# Patient Record
Sex: Female | Born: 1950 | Race: Black or African American | Hispanic: No | Smoking: Never smoker
Health system: Southern US, Community
[De-identification: ages and names within clinical notes are randomized; demographics above are authoritative.]

## PROBLEM LIST (undated history)

## (undated) DIAGNOSIS — E039 Hypothyroidism, unspecified: Secondary | ICD-10-CM

## (undated) DIAGNOSIS — E119 Type 2 diabetes mellitus without complications: Secondary | ICD-10-CM

## (undated) DIAGNOSIS — E079 Disorder of thyroid, unspecified: Secondary | ICD-10-CM

## (undated) DIAGNOSIS — I10 Essential (primary) hypertension: Secondary | ICD-10-CM

## (undated) DIAGNOSIS — J4520 Mild intermittent asthma, uncomplicated: Secondary | ICD-10-CM

## (undated) HISTORY — PX: ABDOMINAL HYSTERECTOMY: SHX81

---

## 2018-10-12 ENCOUNTER — Encounter: Payer: Self-pay | Admitting: Emergency Medicine

## 2018-10-12 ENCOUNTER — Other Ambulatory Visit: Payer: Self-pay

## 2018-10-12 ENCOUNTER — Emergency Department: Payer: BLUE CROSS/BLUE SHIELD

## 2018-10-12 ENCOUNTER — Emergency Department
Admission: EM | Admit: 2018-10-12 | Discharge: 2018-10-12 | Disposition: A | Discharge status: Home / Self Care | Payer: BLUE CROSS/BLUE SHIELD | Attending: Emergency Medicine | Admitting: Emergency Medicine

## 2018-10-12 DIAGNOSIS — Z880 Allergy status to penicillin: Secondary | ICD-10-CM | POA: Diagnosis not present

## 2018-10-12 DIAGNOSIS — R509 Fever, unspecified: Secondary | ICD-10-CM | POA: Diagnosis present

## 2018-10-12 DIAGNOSIS — R05 Cough: Secondary | ICD-10-CM | POA: Diagnosis not present

## 2018-10-12 DIAGNOSIS — Z7984 Long term (current) use of oral hypoglycemic drugs: Secondary | ICD-10-CM | POA: Insufficient documentation

## 2018-10-12 DIAGNOSIS — J988 Other specified respiratory disorders: Secondary | ICD-10-CM | POA: Insufficient documentation

## 2018-10-12 DIAGNOSIS — Z79899 Other long term (current) drug therapy: Secondary | ICD-10-CM | POA: Diagnosis not present

## 2018-10-12 DIAGNOSIS — E119 Type 2 diabetes mellitus without complications: Secondary | ICD-10-CM | POA: Insufficient documentation

## 2018-10-12 DIAGNOSIS — B9789 Other viral agents as the cause of diseases classified elsewhere: Secondary | ICD-10-CM

## 2018-10-12 DIAGNOSIS — I10 Essential (primary) hypertension: Secondary | ICD-10-CM | POA: Diagnosis not present

## 2018-10-12 DIAGNOSIS — Z20828 Contact with and (suspected) exposure to other viral communicable diseases: Secondary | ICD-10-CM | POA: Insufficient documentation

## 2018-10-12 HISTORY — DX: Essential (primary) hypertension: I10

## 2018-10-12 HISTORY — DX: Type 2 diabetes mellitus without complications: E11.9

## 2018-10-12 LAB — COMPREHENSIVE METABOLIC PANEL
ALBUMIN: 2.9 g/dL — AB (ref 3.5–5.0)
ALT: 12 U/L (ref 0–44)
ANION GAP: 10 (ref 5–15)
AST: 20 U/L (ref 15–41)
Alkaline Phosphatase: 45 U/L (ref 38–126)
BILIRUBIN TOTAL: 0.5 mg/dL (ref 0.3–1.2)
BUN: 15 mg/dL (ref 8–23)
CO2: 22 mmol/L (ref 22–32)
Calcium: 8.1 mg/dL — ABNORMAL LOW (ref 8.9–10.3)
Chloride: 102 mmol/L (ref 98–111)
Creatinine, Ser: 0.9 mg/dL (ref 0.44–1.00)
GFR calc Af Amer: >60 mL/min (ref >60)
GFR calc non Af Amer: >60 mL/min (ref >60)
Glucose, Bld: 199 mg/dL — ABNORMAL HIGH (ref 70–99)
Potassium: 3.2 mmol/L — ABNORMAL LOW (ref 3.5–5.1)
Sodium: 134 mmol/L — ABNORMAL LOW (ref 135–145)
Total Protein: 7.2 g/dL (ref 6.5–8.1)

## 2018-10-12 LAB — CBC WITH DIFFERENTIAL/PLATELET
Abs Immature Granulocytes: 0.05 10*3/uL (ref 0.00–0.07)
Basophils Absolute: 0 10*3/uL (ref 0.0–0.1)
Basophils Relative: 0 %
Eosinophils Absolute: 0 10*3/uL (ref 0.0–0.5)
Eosinophils Relative: 0 %
HCT: 34.4 % — ABNORMAL LOW (ref 36.0–46.0)
HEMOGLOBIN: 10.9 g/dL — AB (ref 12.0–15.0)
Immature Granulocytes: 1 %
Lymphocytes Relative: 23 %
Lymphs Abs: 1.9 10*3/uL (ref 0.7–4.0)
MCH: 26.7 pg (ref 26.0–34.0)
MCHC: 31.7 g/dL (ref 30.0–36.0)
MCV: 84.3 fL (ref 80.0–100.0)
MONO ABS: 0.4 10*3/uL (ref 0.1–1.0)
Monocytes Relative: 5 %
Neutro Abs: 5.7 10*3/uL (ref 1.7–7.7)
Neutrophils Relative %: 71 %
Platelets: 413 10*3/uL — ABNORMAL HIGH (ref 150–400)
RBC: 4.08 MIL/uL (ref 3.87–5.11)
RDW: 14.6 % (ref 11.5–15.5)
Smear Review: ADEQUATE
WBC: 8 10*3/uL (ref 4.0–10.5)
nRBC: 0 % (ref 0.0–0.2)

## 2018-10-12 LAB — URINALYSIS, COMPLETE (UACMP) WITH MICROSCOPIC
Bilirubin Urine: NEGATIVE
Glucose, UA: NEGATIVE mg/dL
Hgb urine dipstick: NEGATIVE
Ketones, ur: NEGATIVE mg/dL
Nitrite: NEGATIVE
Protein, ur: 30 mg/dL — AB
Specific Gravity, Urine: 1.019 (ref 1.005–1.030)
pH: 5 (ref 5.0–8.0)

## 2018-10-12 LAB — INFLUENZA PANEL BY PCR (TYPE A & B)
Influenza A By PCR: NEGATIVE
Influenza B By PCR: NEGATIVE

## 2018-10-12 MED ORDER — IOHEXOL 300 MG/ML  SOLN
75.0000 mL | Freq: Once | INTRAMUSCULAR | Status: AC | PRN
  Administered 2018-10-12: 75 mL via INTRAVENOUS

## 2018-10-12 MED ORDER — SODIUM CHLORIDE 0.9 % IV BOLUS
1000.0000 mL | Freq: Once | INTRAVENOUS | Status: AC
  Administered 2018-10-12: 1000 mL via INTRAVENOUS

## 2018-10-12 MED ORDER — AZITHROMYCIN 250 MG PO TABS: ORAL_TABLET | ORAL | 0 refills | Status: AC

## 2018-10-12 NOTE — ED Triage Notes (Signed)
Cough and fever x 5 days. Visiting from [place]. Has been tested negative for flu and strep. No cough.

## 2018-10-12 NOTE — Discharge Instructions (Addendum)
Your CAT scan looks very suspicious for the coronavirus.  Please read over the coronavirus instructions that we have provided to you separately.  Please be sure to quarantine yourself . The test that we have done will take 4 days to get back.  Public health should contact you and let you know that you are clear if it is negative.  Please return here if you get a higher fever or get more short of breath.  Even if you get worse at 3:00 in the morning you need to come back when you get worse.  If need be we will put you in the hospital and give you oxygen.  I have given you prescription for Zithromax in case there is any other kind of pneumonia going on but it does not look like that.

## 2018-10-12 NOTE — ED Notes (Signed)
Went to pt room and she is resting comfortable in NAD - vital signs obtained  - advised that Dr Darnelle Catalan would be to room shortly to discuss test results

## 2018-10-12 NOTE — ED Notes (Signed)
Patient transported to CT 

## 2018-10-12 NOTE — ED Provider Notes (Signed)
[facility] Emergency Department Provider Note   ____________________________________________   First MD Initiated Contact with Patient 10/12/18 [phone]     (approximate)  I have reviewed the triage vital signs and the nursing notes.   HISTORY  Chief Complaint Fever   HPI Jackie French is a 68 y.o. female who is visiting from [place].  She is been running a fever for 5 days.  Was 102 at home today.  She had a negative flu test recently.  Patient reports some mild headache a mild sore throat no nausea vomiting or diarrhea no dysuria no abdominal pain no cough  Past medical history also includes asthma.  Patient is not eating much and only drinking 1 bottle of water a day.      Past Medical History:  Diagnosis Date   Diabetes mellitus without complication (HCC)    Hypertension     There are no active problems to display for this patient.   Past Surgical History:  Procedure Laterality Date   ABDOMINAL HYSTERECTOMY      Prior to Admission medications   Medication Sig Start Date End Date Taking? Authorizing Provider  amLODipine (NORVASC) 5 MG tablet Take 5 mg by mouth daily. 10/04/18  Yes [provider]  atorvastatin (LIPITOR) 20 MG tablet Take 20 mg by mouth Nightly. 09/24/18  Yes [provider]  cetirizine (ZYRTEC) 10 MG tablet Take 10 mg by mouth daily.   Yes [provider]  fluticasone (FLONASE) 50 MCG/ACT nasal spray Place 1 spray into both nostrils daily.   Yes [provider]  metFORMIN (GLUCOPHAGE-XR) 500 MG 24 hr tablet Take 1,000 mg by mouth 2 (two) times daily. 07/16/18 07/16/19 Yes [provider]  montelukast (SINGULAIR) 10 MG tablet Take 10 mg by mouth Nightly.   Yes [provider]  azithromycin (ZITHROMAX Z-PAK) 250 MG tablet Take 2 tablets (500 mg) on  Day 1,  followed by 1 tablet (250 mg) once daily on Days 2 through 5. 10/12/18 10/17/18  Arnaldo Natal, MD     Allergies Penicillins and Shellfish allergy  History reviewed. No pertinent family history.  Social History Social History   Tobacco Use   Smoking status: Not on file  Substance Use Topics   Alcohol use: Not on file   Drug use: Not on file    Review of Systems  Constitutional:  fever/chills Eyes: No visual changes. ENT: Mild sore throat. Cardiovascular: Denies chest pain. Respiratory: Denies shortness of breath. Gastrointestinal: No abdominal pain.  No nausea, no vomiting.  No diarrhea.  No constipation. Genitourinary: Negative for dysuria. Musculoskeletal: Negative for back pain. Skin: Negative for rash. Neurological: Negative for focal weakness or numbness.   ____________________________________________   PHYSICAL EXAM:  VITAL SIGNS: ED Triage Vitals  Enc Vitals Group     BP 10/12/18 0929 137/81     Pulse Rate 10/12/18 0929 89     Resp 10/12/18 0929 20     Temp 10/12/18 0929 98.1 F (36.7 C)     Temp Source 10/12/18 0929 Oral     SpO2 10/12/18 0929 97 %     Weight 10/12/18 0931 210 lb (95.3 kg)     Height 10/12/18 0931 5\' 7"  (1.702 m)     Head Circumference --      Peak Flow --      Pain Score 10/12/18 0931 0     Pain Loc --      Pain Edu? --  Excl. in GC? --     Constitutional: Alert and oriented. Well appearing and in no acute distress. Eyes: Conjunctivae are normal. PERRL. EOMI. Head: Atraumatic. Nose: No congestion/rhinnorhea. Mouth/Throat: Mucous membranes are moist.  Oropharynx non-erythematous. Neck: No stridor.  No cervical spine tenderness to palpation.  Supple Hematological/Lymphatic/Immunilogical: No cervical lymphadenopathy. Cardiovascular: Normal rate, regular rhythm. Grossly normal heart sounds.  Good peripheral circulation. Respiratory: Normal respiratory effort.  No retractions. Lungs CTAB. Gastrointestinal: Soft and nontender. No distention. No abdominal bruits. No CVA tenderness. Musculoskeletal: No lower extremity  tenderness nor edema.  Neurologic:  Normal speech and language. No gross focal neurologic deficits are appreciated.  Patient is slightly unsteady on her feet Skin:  Skin is warm, dry and intact. No rash noted. Psychiatric: Mood and affect are normal. Speech and behavior are normal.  ____________________________________________   LABS (all labs ordered are listed, but only abnormal results are displayed)  Labs Reviewed  URINALYSIS, COMPLETE (UACMP) WITH MICROSCOPIC - Abnormal; Notable for the following components:      Result Value   Color, Urine YELLOW (*)    APPearance HAZY (*)    Protein, ur 30 (*)    Leukocytes,Ua MODERATE (*)    Bacteria, UA RARE (*)    All other components within normal limits  CBC WITH DIFFERENTIAL/PLATELET - Abnormal; Notable for the following components:   Hemoglobin 10.9 (*)    HCT 34.4 (*)    Platelets 413 (*)    All other components within normal limits  COMPREHENSIVE METABOLIC PANEL - Abnormal; Notable for the following components:   Sodium 134 (*)    Potassium 3.2 (*)    Glucose, Bld 199 (*)    Calcium 8.1 (*)    Albumin 2.9 (*)    All other components within normal limits  NOVEL CORONAVIRUS, NAA (HOSPITAL ORDER, SEND-OUT TO REF LAB)  RESPIRATORY PANEL BY PCR  INFLUENZA PANEL BY PCR (TYPE A & B)  CBC WITH DIFFERENTIAL/PLATELET   ____________________________________________  EKG   ____________________________________________  RADIOLOGY  ED MD interpretation: Chest x-ray read by radiology reviewed by me shows some haziness in both bases we will go ahead and CT the patient  Official radiology report(s): Ct Chest W Contrast  Result Date: 10/12/2018 CLINICAL DATA:  Fever and cough 5 days. Visiting from [place]. Tested negative for flu and strep. Currently being tested for Covid19. EXAM: CT CHEST WITH CONTRAST TECHNIQUE: Multidetector CT imaging of the chest was performed during intravenous contrast administration. CONTRAST:  75mL OMNIPAQUE  IOHEXOL 300 MG/ML  SOLN COMPARISON:  Chest x-ray today. FINDINGS: Cardiovascular: Heart is normal size. Mild calcified plaque over the left anterior descending and right coronary arteries. Minimal calcified plaque over the thoracic aorta. Pulmonary arterial system and remaining vascular structures are unremarkable. Mediastinum/Nodes: 1.2 cm AP window lymph node and 1 cm right paratracheal lymph node likely reactive in nature. No significant hilar adenopathy. Substernal goiter. Remaining mediastinal structures are unremarkable. Lungs/Pleura: Lungs are adequately inflated and demonstrate patchy bilateral ground-glass opacification most prominent over the mid to lower lungs and most prominent in a peripheral distribution highly suggestive of COVID19 infection. No evidence of effusion. Airways are normal. Upper Abdomen: No acute findings. Minimal calcified plaque over the abdominal aorta. Musculoskeletal: Unremarkable. IMPRESSION: 1. Pattern of patchy bilateral ground-glass airspace disease highly suggestive of COVID19 infection. Minimal mediastinal adenopathy likely reactive. Bacterial pneumonia versus atypical infectious or inflammatory processes are less likely. 2. Aortic Atherosclerosis (ICD10-I70.0). Mild atherosclerotic coronary artery disease. Findings communicated to Dr. Juliette Alcide in the  ER by Dr. Llana Aliment at 1:05 p.m. Electronically Signed   By: Elberta Fortis M.D.   On: 10/12/2018 13:44   Dg Chest Portable 1 View  Result Date: 10/12/2018 CLINICAL DATA:  Cough and fever 5 days. Visiting from [place]. Negative test for flu and strep. EXAM: PORTABLE CHEST 1 VIEW COMPARISON:  None. FINDINGS: Patient slightly rotated to the left. Lungs are adequately inflated demonstrate subtle patchy density in the lung bases right worse than left which may be due to mild vascular congestion versus atelectasis or infection. No effusion. Cardiomediastinal silhouette and remainder of the exam is unremarkable. IMPRESSION: Subtle  patchy density in the lung bases right worse than left which could be seen with mild vascular congestion, atelectasis or early infection. Electronically Signed   By: Elberta Fortis M.D.   On: 10/12/2018 10:45    ____________________________________________   PROCEDURES  Procedure(s) performed (including Critical Care):  Procedures   ____________________________________________   INITIAL IMPRESSION / ASSESSMENT AND PLAN / ED COURSE  Patient's daughter tells me that the patient herself has been staying in a separate part of the house with his own air circulation system.  Patient has not left that part of the house.  Patient's daughter is been in and out repeatedly.  Patient's children and husband have gone in twice briefly to say hi.  I have not gone in otherwise.  Patient's children are under the care of a nanny.  I have discussed the situation with infection prevention here at [facility].  Infection prevention does not think that is necessary to quarantine the nanny.  I will however have the daughter of the patient inform the nanny of the situation so that if the nanny becomes sick she gets checked quickly.              ____________________________________________   FINAL CLINICAL IMPRESSION(S) / ED DIAGNOSES  Final diagnoses:  Viral respiratory illness     ED Discharge Orders         Ordered    azithromycin (ZITHROMAX Z-PAK) 250 MG tablet     10/12/18 1312           Note:  This document was prepared using Dragon voice recognition software and may include unintentional dictation errors.    Arnaldo Natal, MD 10/12/18 1455

## 2018-10-12 NOTE — ED Notes (Signed)
Due to specimen leakage of fluid from flu swab all labs and swabs need to be recollected - recollected at this time

## 2018-10-13 LAB — RESPIRATORY PANEL BY PCR
Adenovirus: NOT DETECTED
Bordetella pertussis: NOT DETECTED
Chlamydophila pneumoniae: NOT DETECTED
Coronavirus 229E: NOT DETECTED
Coronavirus HKU1: NOT DETECTED
Coronavirus NL63: NOT DETECTED
Coronavirus OC43: NOT DETECTED
INFLUENZA A-RVPPCR: NOT DETECTED
Influenza B: NOT DETECTED
Metapneumovirus: NOT DETECTED
Mycoplasma pneumoniae: NOT DETECTED
Parainfluenza Virus 1: NOT DETECTED
Parainfluenza Virus 2: NOT DETECTED
Parainfluenza Virus 3: NOT DETECTED
Parainfluenza Virus 4: NOT DETECTED
Respiratory Syncytial Virus: NOT DETECTED
Rhinovirus / Enterovirus: NOT DETECTED

## 2018-10-19 LAB — NOVEL CORONAVIRUS, NAA (HOSP ORDER, SEND-OUT TO REF LAB; TAT 18-24 HRS): SARS-CoV-2, NAA: NOT DETECTED

## 2018-10-21 ENCOUNTER — Telehealth: Payer: Self-pay | Admitting: Emergency Medicine

## 2018-10-21 NOTE — Telephone Encounter (Signed)
Called patient to inform of negative covid 19 result.  Spoke to daughter as patient has been confined to room.

## 2019-07-25 HISTORY — PX: THYROIDECTOMY: SHX17

## 2019-09-01 ENCOUNTER — Ambulatory Visit: Payer: BLUE CROSS/BLUE SHIELD

## 2019-09-20 ENCOUNTER — Ambulatory Visit: Payer: BLUE CROSS/BLUE SHIELD

## 2020-03-08 IMAGING — DX PORTABLE CHEST - 1 VIEW
1 series · 1 of 1 positions shown · non-contrast
Comparison: None.

CLINICAL DATA: Cough and fever 5 days. Visiting from [place].
Negative test for flu and strep.

EXAM:
PORTABLE CHEST 1 VIEW

[chest ap]
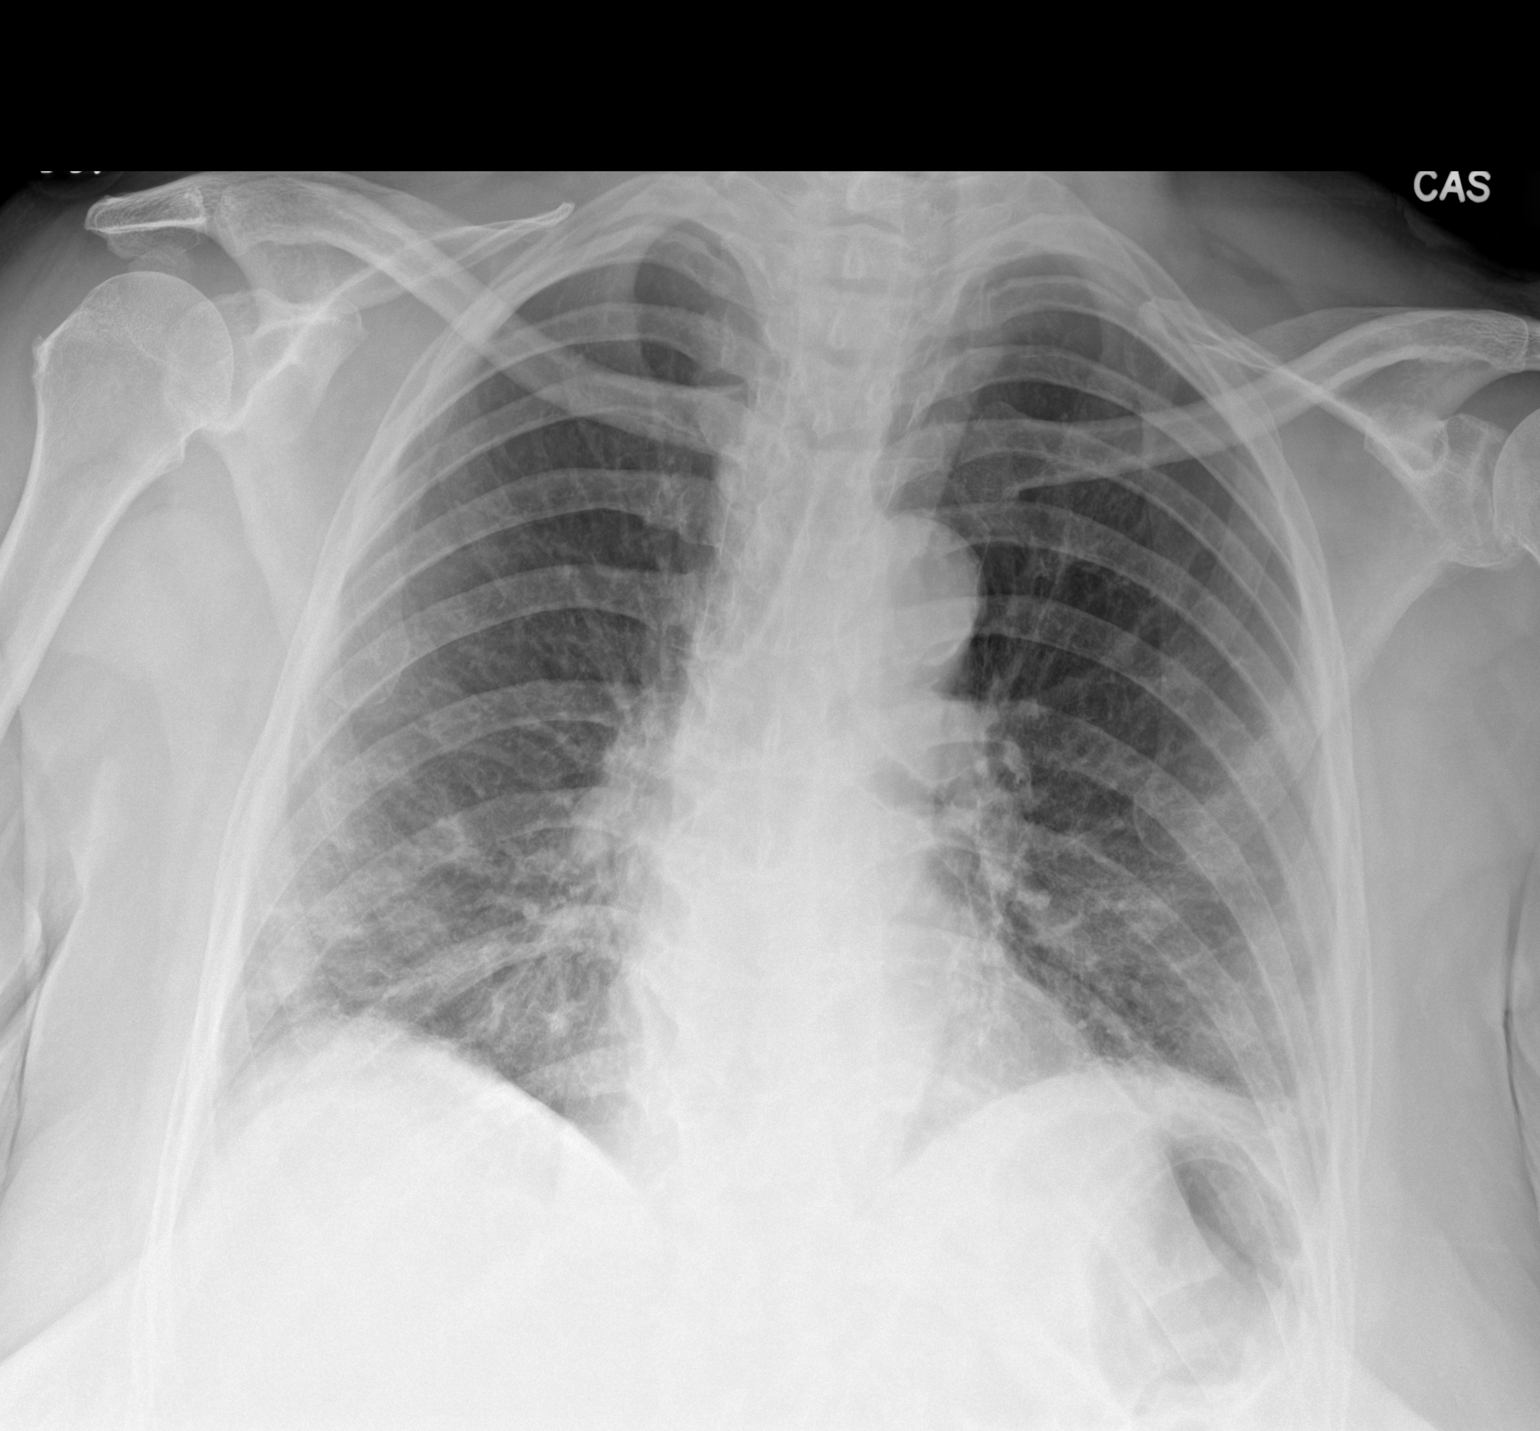

[1 of 1 positions shown; findings below may reference images not displayed]

FINDINGS: Patient slightly rotated to the left. Lungs are adequately inflated
demonstrate subtle patchy density in the lung bases right worse than
left which may be due to mild vascular congestion versus atelectasis
or infection. No effusion. Cardiomediastinal silhouette and
remainder of the exam is unremarkable.
IMPRESSION: Subtle patchy density in the lung bases right worse than left which
could be seen with mild vascular congestion, atelectasis or early
infection.

## 2020-03-08 IMAGING — CT CT CHEST WITH CONTRAST
2 of 3 series · 15 of 36 positions shown, 18 images · IV contrast (omnipaque)
Comparison: Chest x-ray today.

CLINICAL DATA: Fever and cough 5 days. Visiting from [place].
Tested negative for flu and strep. Currently being tested for
PovidY8.

EXAM:
CT CHEST WITH CONTRAST
TECHNIQUE: Multidetector CT imaging of the chest was performed during
intravenous contrast administration.
CONTRAST:  75mL OMNIPAQUE IOHEXOL 300 MG/ML  SOLN

[Series 2: axial st · axial · 0.70mm/px · z∈[-450,-202]mm · 12 of 146 slices shown, 15 images]
[im 11/146  mediastinal]
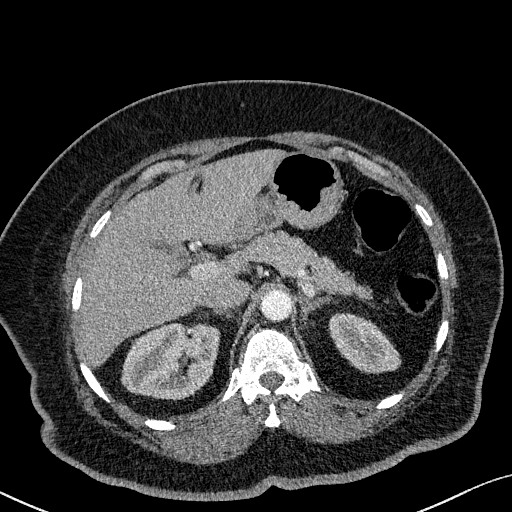
[im 11/146  lung]
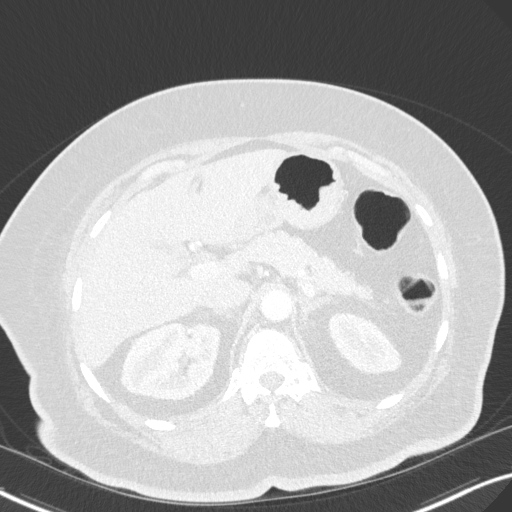
[im 22/146  lung]
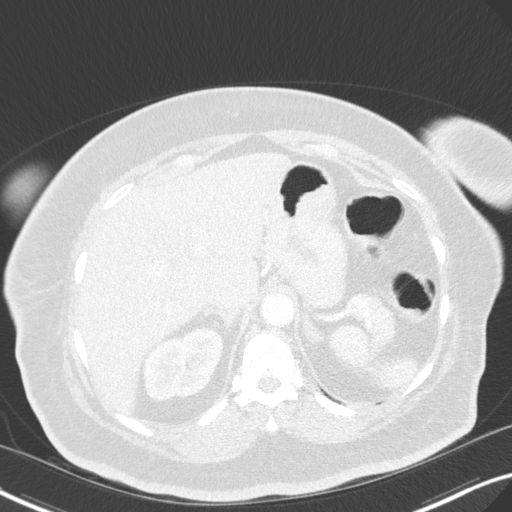
[im 33/146  lung]
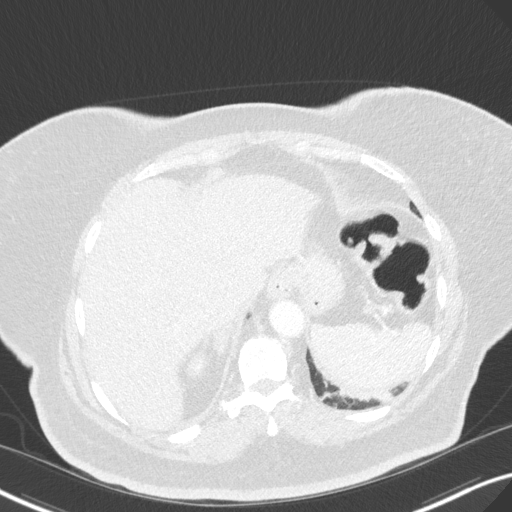
[im 43/146  lung]
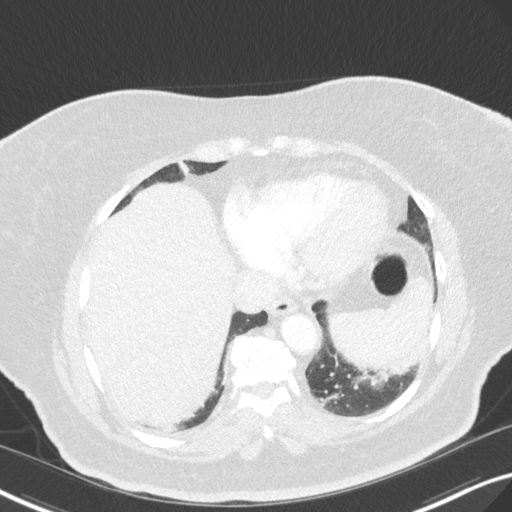
[im 54/146  mediastinal]
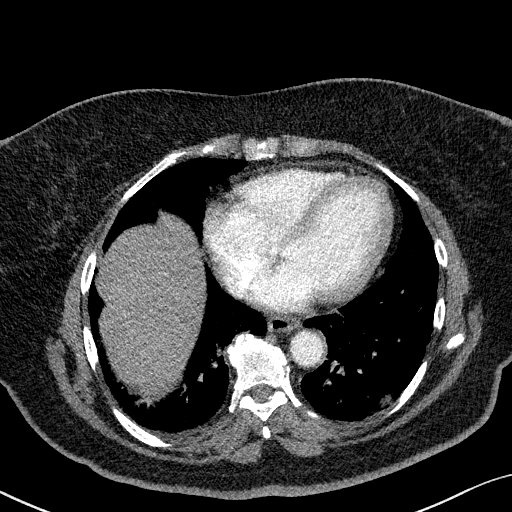
[im 54/146  lung]
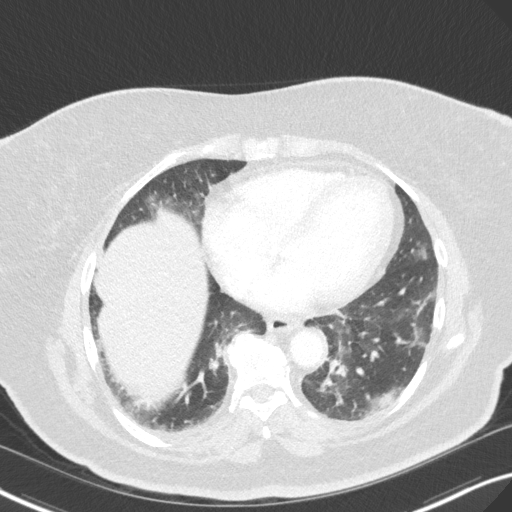
[im 65/146  lung]
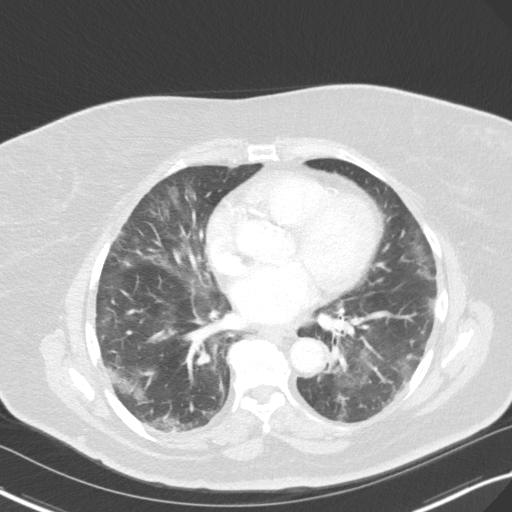
[im 81/146  lung]
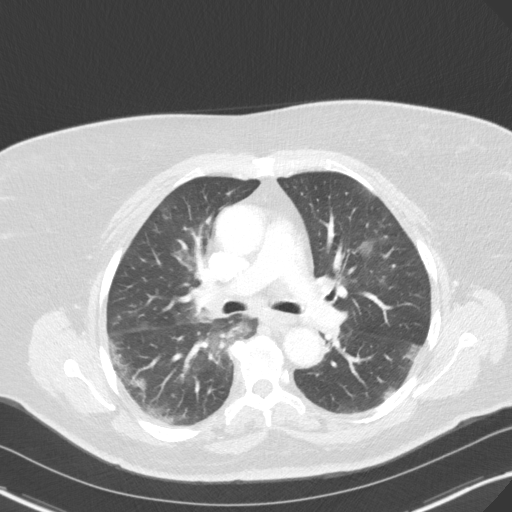
[im 92/146  lung]
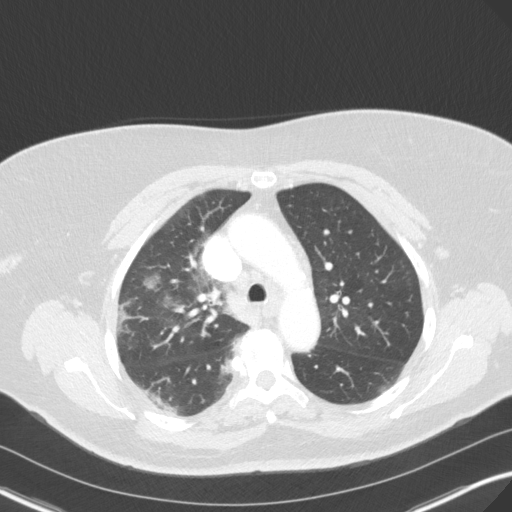
[im 103/146  mediastinal]
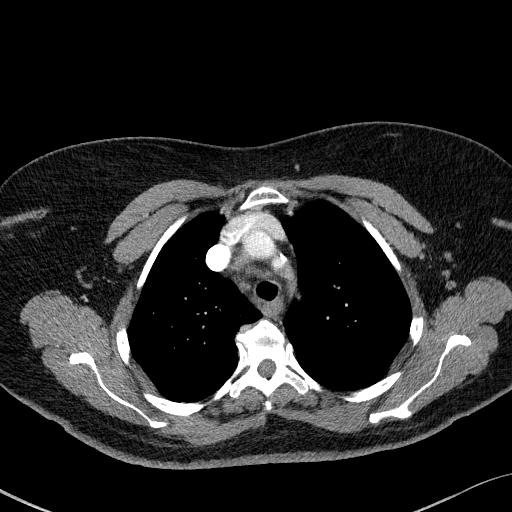
[im 103/146  lung]
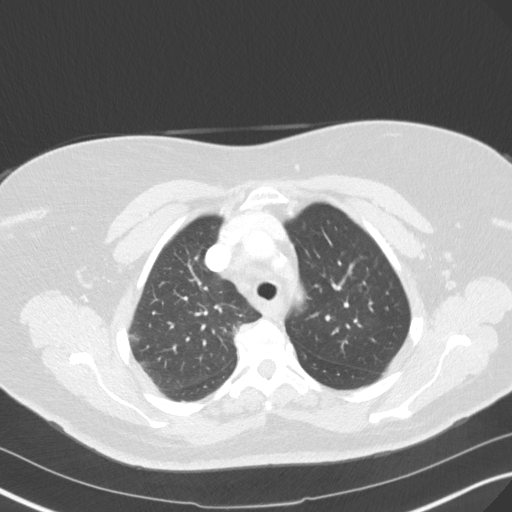
[im 113/146  lung]
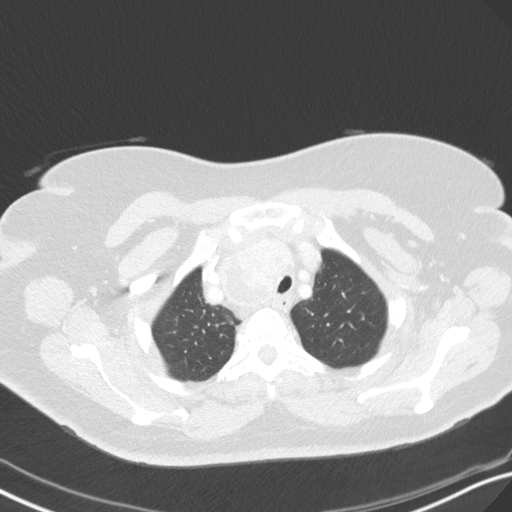
[im 124/146  lung]
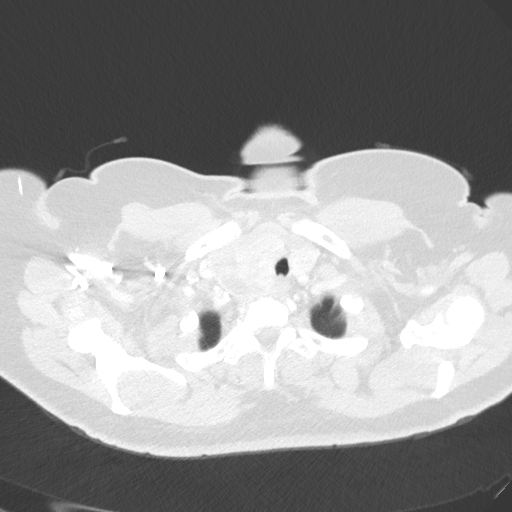
[im 135/146  lung]
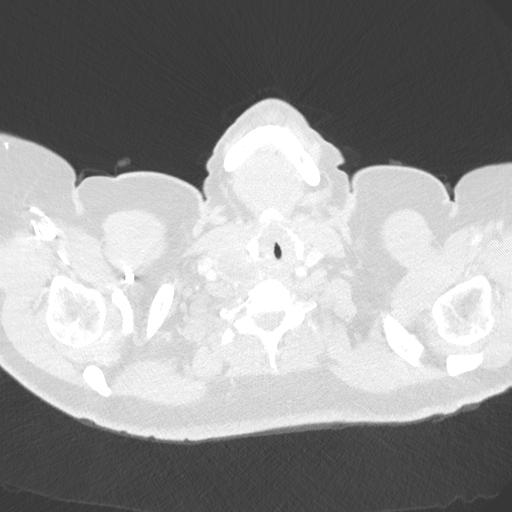

[Series 5: coronal · coronal · 0.59mm/px · 3 of 135 slices shown]
[im 27/135  lung]
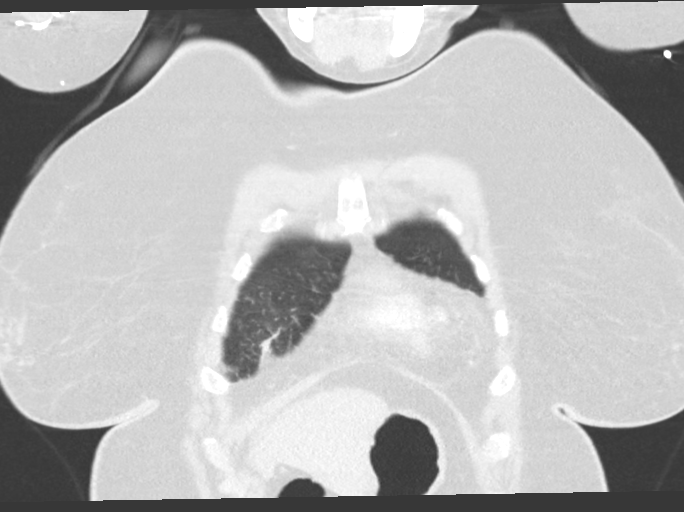
[im 54/135  lung]
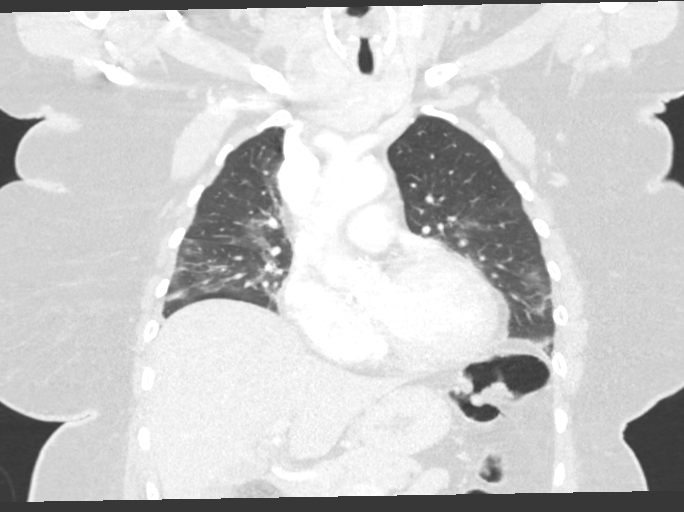
[im 81/135  lung]
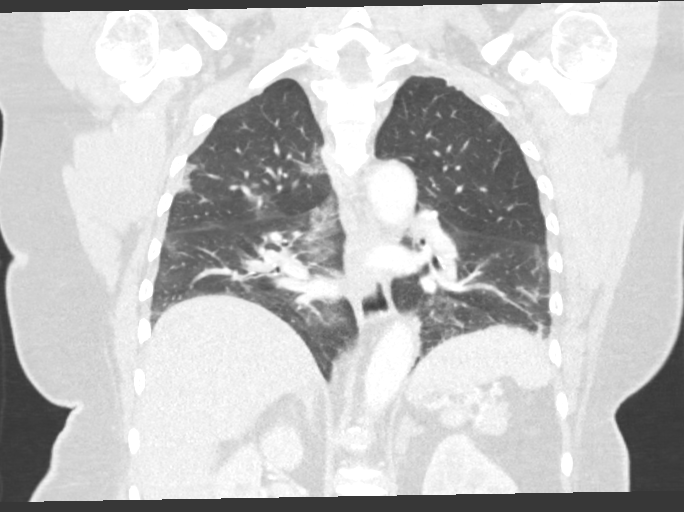

[15 of 36 positions shown; findings below may reference images not displayed]

FINDINGS: Cardiovascular: Heart is normal size. Mild calcified plaque over the
left anterior descending and right coronary arteries. Minimal
calcified plaque over the thoracic aorta. Pulmonary arterial system
and remaining vascular structures are unremarkable.

Mediastinum/Nodes: 1.2 cm AP window lymph node and 1 cm right
paratracheal lymph node likely reactive in nature. No significant
hilar adenopathy. Substernal goiter. Remaining mediastinal
structures are unremarkable.

Lungs/Pleura: Lungs are adequately inflated and demonstrate patchy
bilateral ground-glass opacification most prominent over the mid to
lower lungs and most prominent in a peripheral distribution highly
suggestive of [id number] infection. No evidence of effusion. Airways
are normal.

Upper Abdomen: No acute findings. Minimal calcified plaque over the
abdominal aorta.

Musculoskeletal: Unremarkable.
IMPRESSION: 1. Pattern of patchy bilateral ground-glass airspace disease highly
suggestive of C29JBI8 infection. Minimal mediastinal adenopathy
likely reactive. Bacterial pneumonia versus atypical infectious or
inflammatory processes are less likely.

2. Aortic Atherosclerosis (CJWOB-UE8.8). Mild atherosclerotic
coronary artery disease.

Findings communicated to Dr. Ly in the ER by Dr. Lunsford at
[DATE] p.m.

## 2021-12-10 ENCOUNTER — Other Ambulatory Visit: Payer: Self-pay

## 2021-12-10 ENCOUNTER — Ambulatory Visit
Admission: EM | Admit: 2021-12-10 | Discharge: 2021-12-10 | Disposition: A | Discharge status: Home / Self Care | Payer: Medicare Other

## 2021-12-10 ENCOUNTER — Encounter: Payer: Self-pay | Admitting: Emergency Medicine

## 2021-12-10 DIAGNOSIS — R112 Nausea with vomiting, unspecified: Secondary | ICD-10-CM

## 2021-12-10 DIAGNOSIS — Z7984 Long term (current) use of oral hypoglycemic drugs: Secondary | ICD-10-CM

## 2021-12-10 DIAGNOSIS — B349 Viral infection, unspecified: Secondary | ICD-10-CM

## 2021-12-10 DIAGNOSIS — E119 Type 2 diabetes mellitus without complications: Secondary | ICD-10-CM

## 2021-12-10 HISTORY — DX: Disorder of thyroid, unspecified: E07.9

## 2021-12-10 LAB — POCT FASTING CBG KUC MANUAL ENTRY: POCT Glucose (KUC): 112 mg/dL — AB (ref 70–99)

## 2021-12-10 MED ORDER — ONDANSETRON 4 MG PO TBDP: 4.0000 mg | ORAL_TABLET | Freq: Three times a day (TID) | ORAL | 0 refills | Status: AC | PRN

## 2021-12-10 NOTE — ED Provider Notes (Signed)
[name]    CSN: [id number] Arrival date & time: 12/10/21  1436      History   Chief Complaint Chief Complaint  Patient presents with   Vomiting    HPI Jackie French is a 71 y.o. female.  Accompanied by her daughter, patient presents with headache, nausea, and one episode of emesis today.  She also reports mild ear pain and mild occasional cough.  Patient denies fever, chills, sore throat, chest pain, shortness of breath, diarrhea, constipation, or other symptoms.  No OTC medications taken today.  Her medical history includes hypertension, diabetes, thyroid disease.  Patient is here in [place] visiting her family; she is supposed to fly home to [place] today.  The history is provided by the patient and a relative.   Past Medical History:  Diagnosis Date   Diabetes mellitus without complication (HCC)    Hypertension    Thyroid disease     There are no problems to display for this patient.   Past Surgical History:  Procedure Laterality Date   ABDOMINAL HYSTERECTOMY      OB History   No obstetric history on file.      Home Medications    Prior to Admission medications   Medication Sig Start Date End Date Taking? Authorizing Provider  Levothyroxine Sodium (SYNTHROID PO) Take by mouth.   Yes [provider]  losartan-hydrochlorothiazide (HYZAAR) 100-25 MG tablet Take 1 tablet by mouth daily.   Yes [provider]  ondansetron (ZOFRAN-ODT) 4 MG disintegrating tablet Take 1 tablet (4 mg total) by mouth every 8 (eight) hours as needed for nausea or vomiting. 12/10/21  Yes Mickie Bail, NP  amLODipine (NORVASC) 5 MG tablet Take 5 mg by mouth daily. 10/04/18   [provider]  atorvastatin (LIPITOR) 20 MG tablet Take 20 mg by mouth Nightly. 09/24/18   [provider]  cetirizine (ZYRTEC) 10 MG tablet Take 10 mg by mouth daily.    [provider]  fluticasone (FLONASE) 50 MCG/ACT nasal spray Place 1 spray into  both nostrils daily.    [provider]  metFORMIN (GLUCOPHAGE-XR) 500 MG 24 hr tablet Take 1,000 mg by mouth 2 (two) times daily. 07/16/18 07/16/19  [provider]  montelukast (SINGULAIR) 10 MG tablet Take 10 mg by mouth Nightly.    [provider]    Family History No family history on file.  Social History Social History   Vaping Use   Vaping Use: Never used  Substance Use Topics   Alcohol use: Yes     Allergies   Penicillins and Shellfish allergy   Review of Systems Review of Systems  Constitutional:  Negative for chills and fever.  HENT:  Positive for ear pain. Negative for sore throat.   Respiratory:  Positive for cough. Negative for shortness of breath.   Cardiovascular:  Negative for chest pain and palpitations.  Gastrointestinal:  Positive for nausea and vomiting. Negative for abdominal pain, constipation and diarrhea.  Skin:  Negative for color change and rash.  Neurological:  Positive for headaches.  All other systems reviewed and are negative.   Physical Exam Triage Vital Signs ED Triage Vitals  Enc Vitals Group     BP      Pulse      Resp      Temp      Temp src      SpO2      Weight      Height  Head Circumference      Peak Flow      Pain Score      Pain Loc      Pain Edu?      Excl. in GC?    No data found.  Updated Vital Signs BP (!) 148/92   Pulse 83   Temp 97.9 F (36.6 C)   Resp 18   SpO2 99%   Visual Acuity Right Eye Distance:   Left Eye Distance:   Bilateral Distance:    Right Eye Near:   Left Eye Near:    Bilateral Near:     Physical Exam Vitals and nursing note reviewed.  Constitutional:      General: She is not in acute distress.    Appearance: Normal appearance. She is well-developed. She is not ill-appearing.  HENT:     Right Ear: Tympanic membrane normal.     Left Ear: Tympanic membrane normal.     Nose: Nose normal.     Mouth/Throat:     Mouth: Mucous membranes are moist.      Pharynx: Oropharynx is clear.  Cardiovascular:     Rate and Rhythm: Normal rate and regular rhythm.     Heart sounds: Normal heart sounds.  Pulmonary:     Effort: Pulmonary effort is normal. No respiratory distress.     Breath sounds: Normal breath sounds.  Abdominal:     General: Bowel sounds are normal.     Palpations: Abdomen is soft.     Tenderness: There is no abdominal tenderness. There is no guarding or rebound.  Musculoskeletal:     Cervical back: Neck supple.  Skin:    General: Skin is warm and dry.  Neurological:     Mental Status: She is alert.     Gait: Gait normal.  Psychiatric:        Mood and Affect: Mood normal.        Behavior: Behavior normal.     UC Treatments / Results  Labs (all labs ordered are listed, but only abnormal results are displayed) Labs Reviewed  POCT FASTING CBG [facility] MANUAL ENTRY - Abnormal; Notable for the following components:      Result Value   POCT Glucose ([facility]) 112 (*)    All other components within normal limits    EKG   Radiology No results found.  Procedures Procedures (including critical care time)  Medications Ordered in UC Medications - No data to display  Initial Impression / Assessment and Plan / UC Course  I have reviewed the triage vital signs and the nursing notes.  Pertinent labs & imaging results that were available during my care of the patient were reviewed by me and considered in my medical decision making (see chart for details).     Nausea and vomiting, Viral illness.  Patient is supposed to fly home to [place] today.  Discussed at-home COVID test.  Patient is well-appearing and her exam is reassuring.  Abdomen is soft and nontender with good bowel sounds.  She is febrile and vital signs are stable.  Treating nausea and vomiting with Zofran.  Instructed patient to stay hydrated with clear liquids.  Advance diet as tolerated.  ED precautions discussed.  Education provided on nausea and vomiting and on viral  illnesses.  Instructed patient to follow-up with her PCP on Monday.  Patient and her daughter agree to plan of care.    Final Clinical Impressions(s) / UC Diagnoses   Final diagnoses:  Nausea and  vomiting, unspecified vomiting type  Viral illness     Discharge Instructions      Take the antinausea medication as directed.    Keep yourself hydrated with clear liquids, such as water and diet Gatorade.    Go to the emergency department if you have acute worsening symptoms.    Follow up with your primary care provider on Monday.          ED Prescriptions     Medication Sig Dispense Auth. Provider   ondansetron (ZOFRAN-ODT) 4 MG disintegrating tablet Take 1 tablet (4 mg total) by mouth every 8 (eight) hours as needed for nausea or vomiting. 20 tablet Mickie Bailate, Jeramine Delis H, NP      PDMP not reviewed this encounter.   Mickie Bailate, Jalee Saine H, NP 12/10/21 1537

## 2021-12-10 NOTE — Discharge Instructions (Addendum)
Take the antinausea medication as directed.    Keep yourself hydrated with clear liquids, such as water and diet Gatorade.    Go to the emergency department if you have acute worsening symptoms.    Follow up with your primary care provider on Monday.

## 2021-12-10 NOTE — ED Triage Notes (Signed)
Headache, pointing to left side of head as location of pain. Reports nausea and vomiting that started today.  Patient has spent time with family that recently has had illness.  When in [place], patient stays with her family.Patient has felt warm to daughter.  One episode of vomiting today.    Patient has a flight today.  Daughter doesn't think patient should go home today.  Patient reports a normal BM

## 2022-11-30 ENCOUNTER — Encounter: Payer: Self-pay | Admitting: Ophthalmology

## 2022-12-01 ENCOUNTER — Encounter: Payer: Self-pay | Admitting: Ophthalmology

## 2022-12-06 NOTE — Discharge Instructions (Signed)

## 2022-12-08 ENCOUNTER — Ambulatory Visit: Payer: Medicare Other | Admitting: Anesthesiology

## 2022-12-08 ENCOUNTER — Ambulatory Visit
Admission: RE | Admit: 2022-12-08 | Discharge: 2022-12-08 | Disposition: A | Discharge status: Other Acute Inpatient Hospital | Payer: Medicare Other | Attending: Ophthalmology | Admitting: Ophthalmology

## 2022-12-08 ENCOUNTER — Other Ambulatory Visit: Payer: Self-pay

## 2022-12-08 ENCOUNTER — Encounter: Payer: Self-pay | Admitting: Ophthalmology

## 2022-12-08 ENCOUNTER — Emergency Department
Admission: EM | Admit: 2022-12-08 | Discharge: 2022-12-08 | Disposition: A | Discharge status: Home / Self Care | Payer: Medicare Other | Source: Home / Self Care | Attending: Emergency Medicine | Admitting: Emergency Medicine

## 2022-12-08 ENCOUNTER — Encounter
Admission: RE | Disposition: A | Discharge status: Other Acute Inpatient Hospital | Payer: Self-pay | Source: Home / Self Care | Attending: Ophthalmology

## 2022-12-08 DIAGNOSIS — I10 Essential (primary) hypertension: Secondary | ICD-10-CM | POA: Insufficient documentation

## 2022-12-08 DIAGNOSIS — M7981 Nontraumatic hematoma of soft tissue: Secondary | ICD-10-CM | POA: Insufficient documentation

## 2022-12-08 DIAGNOSIS — H02105 Unspecified ectropion of left lower eyelid: Secondary | ICD-10-CM | POA: Insufficient documentation

## 2022-12-08 DIAGNOSIS — Z7984 Long term (current) use of oral hypoglycemic drugs: Secondary | ICD-10-CM | POA: Diagnosis not present

## 2022-12-08 DIAGNOSIS — E039 Hypothyroidism, unspecified: Secondary | ICD-10-CM | POA: Insufficient documentation

## 2022-12-08 DIAGNOSIS — E119 Type 2 diabetes mellitus without complications: Secondary | ICD-10-CM | POA: Insufficient documentation

## 2022-12-08 DIAGNOSIS — R001 Bradycardia, unspecified: Secondary | ICD-10-CM | POA: Insufficient documentation

## 2022-12-08 DIAGNOSIS — J45909 Unspecified asthma, uncomplicated: Secondary | ICD-10-CM | POA: Insufficient documentation

## 2022-12-08 DIAGNOSIS — R55 Syncope and collapse: Secondary | ICD-10-CM | POA: Insufficient documentation

## 2022-12-08 HISTORY — PX: ECTROPION REPAIR: SHX357

## 2022-12-08 HISTORY — DX: Hypothyroidism, unspecified: E03.9

## 2022-12-08 HISTORY — DX: Mild intermittent asthma, uncomplicated: J45.20

## 2022-12-08 LAB — CBC
HCT: 40.9 % (ref 36.0–46.0)
Hemoglobin: 12.7 g/dL (ref 12.0–15.0)
MCH: 27.4 pg (ref 26.0–34.0)
MCHC: 31.1 g/dL (ref 30.0–36.0)
MCV: 88.3 fL (ref 80.0–100.0)
Platelets: 309 10*3/uL (ref 150–400)
RBC: 4.63 MIL/uL (ref 3.87–5.11)
RDW: 14.3 % (ref 11.5–15.5)
WBC: 11 10*3/uL — ABNORMAL HIGH (ref 4.0–10.5)
nRBC: 0 % (ref 0.0–0.2)

## 2022-12-08 LAB — BASIC METABOLIC PANEL
Anion gap: 12 (ref 5–15)
BUN: 13 mg/dL (ref 8–23)
CO2: 22 mmol/L (ref 22–32)
Calcium: 8.8 mg/dL — ABNORMAL LOW (ref 8.9–10.3)
Chloride: 103 mmol/L (ref 98–111)
Creatinine, Ser: 0.85 mg/dL (ref 0.44–1.00)
GFR, Estimated: >60 mL/min (ref >60)
Glucose, Bld: 113 mg/dL — ABNORMAL HIGH (ref 70–99)
Potassium: 3.7 mmol/L (ref 3.5–5.1)
Sodium: 137 mmol/L (ref 135–145)

## 2022-12-08 LAB — GLUCOSE, CAPILLARY
Glucose-Capillary: 90 mg/dL (ref 70–99)
Glucose-Capillary: 125 mg/dL — ABNORMAL HIGH (ref 70–99)

## 2022-12-08 LAB — TROPONIN I (HIGH SENSITIVITY)
Troponin I (High Sensitivity): 6 ng/L (ref <18)
Troponin I (High Sensitivity): 3 ng/L (ref <18)

## 2022-12-08 SURGERY — REPAIR, ECTROPION, EYELID
Anesthesia: Monitor Anesthesia Care | Site: Eye | Laterality: Bilateral

## 2022-12-08 MED ORDER — FENTANYL CITRATE (PF) 100 MCG/2ML IJ SOLN
INTRAMUSCULAR | Status: DC | PRN
  Administered 2022-12-08: 25 ug via INTRAVENOUS

## 2022-12-08 MED ORDER — ERYTHROMYCIN 5 MG/GM OP OINT
TOPICAL_OINTMENT | OPHTHALMIC | Status: DC | PRN
  Administered 2022-12-08: 1 via OPHTHALMIC

## 2022-12-08 MED ORDER — ACETAMINOPHEN 325 MG PO TABS
650.0000 mg | ORAL_TABLET | Freq: Once | ORAL | Status: AC
  Administered 2022-12-08: 650 mg via ORAL

## 2022-12-08 MED ORDER — GLYCOPYRROLATE 0.2 MG/ML IJ SOLN
0.2000 mg | Freq: Once | INTRAMUSCULAR | Status: AC
  Administered 2022-12-08: 0.2 mg via INTRAVENOUS

## 2022-12-08 MED ORDER — PROPOFOL 10 MG/ML IV BOLUS
INTRAVENOUS | Status: DC | PRN
  Administered 2022-12-08: 25 ug/kg/min via INTRAVENOUS

## 2022-12-08 MED ORDER — LACTATED RINGERS IV SOLN: INTRAVENOUS | Status: DC

## 2022-12-08 MED ORDER — MIDAZOLAM HCL 2 MG/2ML IJ SOLN
INTRAMUSCULAR | Status: DC | PRN
  Administered 2022-12-08 (×2): 1 mg via INTRAVENOUS

## 2022-12-08 MED ORDER — TRAMADOL HCL 50 MG PO TABS: ORAL_TABLET | ORAL | 0 refills | Status: AC

## 2022-12-08 MED ORDER — LIDOCAINE-EPINEPHRINE 2 %-1:100000 IJ SOLN
INTRAMUSCULAR | Status: DC | PRN
  Administered 2022-12-08: 5 mL via OPHTHALMIC

## 2022-12-08 MED ORDER — ONDANSETRON HCL 4 MG/2ML IJ SOLN
INTRAMUSCULAR | Status: DC | PRN
  Administered 2022-12-08: 4 mg via INTRAVENOUS

## 2022-12-08 MED ORDER — ONDANSETRON HCL 4 MG/2ML IJ SOLN
4.0000 mg | Freq: Once | INTRAMUSCULAR | Status: AC
  Administered 2022-12-08: 4 mg via INTRAVENOUS

## 2022-12-08 MED ORDER — ERYTHROMYCIN 5 MG/GM OP OINT: TOPICAL_OINTMENT | OPHTHALMIC | 2 refills | Status: AC

## 2022-12-08 MED ORDER — BSS IO SOLN
INTRAOCULAR | Status: DC | PRN
  Administered 2022-12-08: 15 mL

## 2022-12-08 MED ORDER — TETRACAINE HCL 0.5 % OP SOLN
OPHTHALMIC | Status: DC | PRN
  Administered 2022-12-08: 1 [drp] via OPHTHALMIC
  Administered 2022-12-08: 2 [drp] via OPHTHALMIC

## 2022-12-08 SURGICAL SUPPLY — 23 items
APPLICATOR COTTON TIP WD 3 STR (MISCELLANEOUS) ×2 IMPLANT
BLADE SURG 15 STRL LF DISP TIS (BLADE) ×1 IMPLANT
BLADE SURG 15 STRL SS (BLADE) ×1
CORD BIP STRL DISP 12FT (MISCELLANEOUS) ×1 IMPLANT
GAUZE SPONGE 2X2 STRL 8-PLY (GAUZE/BANDAGES/DRESSINGS) IMPLANT
GAUZE SPONGE 4X4 12PLY STRL (GAUZE/BANDAGES/DRESSINGS) ×1 IMPLANT
GLOVE SURG UNDER POLY LF SZ7 (GLOVE) ×2 IMPLANT
MARKER SKIN XFINE TIP W/RULER (MISCELLANEOUS) ×1 IMPLANT
NDL FILTER BLUNT 18X1 1/2 (NEEDLE) ×1 IMPLANT
NDL HYPO 30X.5 LL (NEEDLE) ×2 IMPLANT
NEEDLE FILTER BLUNT 18X1 1/2 (NEEDLE) ×1 IMPLANT
NEEDLE HYPO 30X.5 LL (NEEDLE) ×2 IMPLANT
PACK ENT CUSTOM (PACKS) ×1 IMPLANT
SOL PREP PVP 2OZ (MISCELLANEOUS) ×1
SOLUTION PREP PVP 2OZ (MISCELLANEOUS) ×1 IMPLANT
SUT CHROMIC 4-0 (SUTURE) ×2
SUT CHROMIC 4-0 M2 12X2 ARM (SUTURE) ×2
SUT GUT PLAIN 6-0 1X18 ABS (SUTURE) ×1 IMPLANT
SUT MERSILENE 4-0 S-2 (SUTURE) ×1 IMPLANT
SUTURE CHRMC 4-0 M2 12X2 ARM (SUTURE) IMPLANT
SYR 10ML LL (SYRINGE) ×1 IMPLANT
SYR 3ML LL SCALE MARK (SYRINGE) ×1 IMPLANT
WATER STERILE IRR 250ML POUR (IV SOLUTION) ×1 IMPLANT

## 2022-12-08 NOTE — Anesthesia Postprocedure Evaluation (Signed)
Anesthesia Post Note  Patient: Jackie French  Procedure(s) Performed: ECTROPION REPAIR, TARSAL WEDGE ECTROPION REPAIR, EXTENSIVE BILATERAL (Bilateral: Eye)  Patient location during evaluation: PACU Anesthesia Type: MAC Level of consciousness: awake and alert Pain management: pain level controlled Vital Signs Assessment: post-procedure vital signs reviewed and stable Respiratory status: spontaneous breathing, nonlabored ventilation, respiratory function stable and patient connected to nasal cannula oxygen Cardiovascular status: stable and blood pressure returned to baseline Postop Assessment: no apparent nausea or vomiting Anesthetic complications: no Comments: Post procedure, patient getting ready to go home , IV had been dc/d, daughter w/patient, when patient  became hypotensive, bradycardic, and "felt bad." Daughter (who is a Teacher, music) notified nursing staff, who notified me to examine patient I French only a few feet away, so immediately checked patient.  Patient immediately placed supine in "stretcher position" of chair, legs elevated, pulse oximetry, EKG monitors, oxygen  2 L/n/c,  BP monitor, applied. Heart rate 32-34, rhythm strip showed sinus bradycardia w/some sinus arrhythmia. Skin warm and dry.  Pulse ox 99-100%, eupnea.  Hypotensive BP 80s/60s.  Patient more somnolent, but no loss of consciousness, would open open eyes to command and breathe deeply. CRNA Courtney placed IV in right hand for medication and IV fluid access.   Due to symptomatic bradycardia, patient received glycopyrrolate 0.2 MG IV, which resulted in heart rate rising to 50s, and BP also rose.  Did not need vasopressor meds once heart rate rose.    EKG obtained, sinus bradycardia, no EKG to compare, but then patient's daughter called office of Dr. Palma Holter in [place], and they sent copy EKG from year or so ago, showed sinus bradycardia, sinus arrhythmia, LVH.    Appears to be vasovagal syncopal episode.   Patient stable, heart rate rose to low 60s, high 50s, BP up to 120s/70s, patient denies nausea, chest pain or pressure, skin warm and dry.  Discussed w/patient and daughter, it is possible that she experienced loss of perfusion to heart and may have experienced adverse episode or loss of flow to heart, so daughter will follow ambulance to hospital.  Patient very stable and not likely to have suffered injury, but due to her vital signs today, and the fact that at an [facility], we do not have ability here for cardiac enzymes nor for followup, will send via ambulance to ER for patient safety.   Spoke on phone with Dr. Minna Antis  of [facility] as accepting physician. Patient transported per EMS.  At 1417, I  spoke on phone with Debbra Riding, PA for Dr. Jonn Shingles of [place], [place]. Mr. Harlon Flor will call and ask patient to come Monday for further checkup and to either see Dr. Burnadette Pop or Mr. Whitaker and to determine whether echo or cardiac workup is indicated.     No notable events documented.   Last Vitals:  Vitals:   12/08/22 1125 12/08/22 1130  BP: 126/72 137/72  Pulse: (!) 56 (!) 50  Resp: 13 10  Temp:    SpO2: 99% 100%    Last Pain:  Vitals:   12/08/22 0957  TempSrc:   PainSc: 6                  Cyndal Kasson C Marcellis Frampton

## 2022-12-08 NOTE — Transfer of Care (Signed)
Immediate Anesthesia Transfer of Care Note  Patient: Jackie French  Procedure(s) Performed: ECTROPION REPAIR, TARSAL WEDGE ECTROPION REPAIR, EXTENSIVE BILATERAL (Bilateral: Eye)  Patient Location: PACU  Anesthesia Type: MAC  Level of Consciousness: awake, alert  and patient cooperative  Airway and Oxygen Therapy: Patient Spontanous Breathing and Patient connected to supplemental oxygen  Post-op Assessment: Post-op Vital signs reviewed, Patient's Cardiovascular Status Stable, Respiratory Function Stable, Patent Airway and No signs of Nausea or vomiting  Post-op Vital Signs: Reviewed and stable  Complications: No notable events documented.

## 2022-12-08 NOTE — Discharge Instructions (Addendum)
Your blood work and EKG were reassuring.  I suspect that this was a vagal response in the setting of your eyelid surgery.  If you have additional episodes of lightheadedness or you develop any chest pain or difficulty breathing then please return to the emergency department.  Otherwise please follow-up with cardiology as an outpatient.  You should receive a phone call from the office to schedule an appointment but if you do not you can call the number above to schedule an appointment.

## 2022-12-08 NOTE — ED Notes (Addendum)
EDP at [facility] speaking with pt, and daughter. Pt alert, NAD, calm, interactive, resps e/u, speech clear. Denies current dizziness. Endorses mild light headedness. Earlier sx resolved. Eye bleeding/ oozing and clot develop while in waiting room. External clot removed from R lower eye lash. No active bleeding.

## 2022-12-08 NOTE — Progress Notes (Signed)
As patient starting to get dressed stated to daughter "I don't feel good", attached to monitor heart rate in 34 at 1015 Anesthesiologist at bedside, restarted IV and started fluids per order, placed nasal cannula oxygen, obtained BP, meds given per order documented in  MAR. 12 lead EKG obtained per order. At 10 30 heart rate 52, BP 115/63, will continue to monitor until ready to discharge per anesthesia.

## 2022-12-08 NOTE — ED Provider Notes (Signed)
[facility] Provider Note    Event Date/Time   First MD Initiated Contact with Patient 12/08/22 1512     (approximate)   History   Bradycardia and Hypotension   HPI  Jackie French is a 72 y.o. female past medical history of asthma, diabetes, hypertension, hypothyroidism who presents because of low heart rate and blood pressure during outpatient procedure.  Patient was having surgery on her lower eyelids in the outpatient surgical center.  She received Versed propofol and fentanyl for the procedure.  Afterward and recovery she started to feel lightheaded and unwell and her heart rate was noted to be in the 30s.  Her daughter thinks it was like this for several minutes.  Her blood pressure did drop to the 90s over 50s.  She received glycopyrrolate and a bolus of fluid eventually heart rate started to improve and she felt better.  He denies any history of similar. I reviewed anesthesia note from the procedure as below:   Post procedure, patient getting ready to go home , IV had been dc/d, daughter w/patient, when patient  became hypotensive, bradycardic, and "felt bad." Daughter (who is a Teacher, music) notified nursing staff, who notified me to examine patient I was only a few feet away, so immediately checked patient.  Patient immediately placed supine in "stretcher position" of chair, legs elevated, pulse oximetry, EKG monitors, oxygen  2 L/n/c,  BP monitor, applied. Heart rate 32-34, rhythm strip showed sinus bradycardia w/some sinus arrhythmia. Skin warm and dry.  Pulse ox 99-100%, eupnea.  Hypotensive BP 80s/60s.  Patient more somnolent, but no loss of consciousness, would open open eyes to command and breathe deeply. CRNA Courtney placed IV in right hand for medication and IV fluid access.    Due to symptomatic bradycardia, patient received glycopyrrolate 0.2 MG IV, which resulted in heart rate rising to 50s, and BP also rose.  Did not need vasopressor meds  once heart rate rose.     EKG obtained, sinus bradycardia, no EKG to compare, but then patient's daughter called office of Dr. Palma Holter in [place], and they sent copy EKG from year or so ago, showed sinus bradycardia, sinus arrhythmia, LVH.    Appears to be vasovagal syncopal episode.  Patient stable, heart rate rose to low 60s, high 50s, BP up to 120s/70s, patient denies nausea, chest pain or pressure, skin warm and dry.  Discussed w/patient and daughter, it is possible that she experienced loss of perfusion to heart and may have experienced adverse episode or loss of flow to heart, so daughter will follow ambulance to hospital.  Patient very stable and not likely to have suffered injury, but due to her vital signs today, and the fact that at an [facility], we do not have ability here for cardiac enzymes nor for followup, will send via ambulance to ER for patient safety.      Past Medical History:  Diagnosis Date   Asthma, intermittent, uncomplicated    Diabetes mellitus without complication (HCC)    Hypertension    Hypothyroidism    Thyroid disease     There are no problems to display for this patient.    Physical Exam  Triage Vital Signs: ED Triage Vitals  Enc Vitals Group     BP 12/08/22 1257 (!) 153/73     Pulse Rate 12/08/22 1257 (!) 59     Resp 12/08/22 1257 14     Temp 12/08/22 1257 98 F (36.7 C)  Temp Source 12/08/22 1257 Oral     SpO2 12/08/22 1257 100 %     Weight 12/08/22 1258 209 lb 7 oz (95 kg)     Height 12/08/22 1258 5\' 7"  (1.702 m)     Head Circumference --      Peak Flow --      Pain Score 12/08/22 1258 0     Pain Loc --      Pain Edu? --      Excl. in GC? --     Most recent vital signs: Vitals:   12/08/22 1555 12/08/22 1600  BP:  (!) 149/76  Pulse: 67 (!) 54  Resp: 16 14  Temp:    SpO2: 100% 100%     General: Awake, no distress.  CV:  Good peripheral perfusion. Bradycardic  Resp:  Normal effort.  Abd:  No distention.  Neuro:              Awake, Alert, Oriented x 3  Other:  Lower eyelids with some swelling and some clotted blood on the right lower eyelid   ED Results / Procedures / Treatments  Labs (all labs ordered are listed, but only abnormal results are displayed) Labs Reviewed  CBC - Abnormal; Notable for the following components:      Result Value   WBC 11.0 (*)    All other components within normal limits  BASIC METABOLIC PANEL - Abnormal; Notable for the following components:   Glucose, Bld 113 (*)    Calcium 8.8 (*)    All other components within normal limits  TROPONIN I (HIGH SENSITIVITY)  TROPONIN I (HIGH SENSITIVITY)     EKG  Sinus bradycarida, LVH, LAD, no acute ischemic changes    RADIOLOGY    PROCEDURES:  Critical Care performed: No  .1-3 Lead EKG Interpretation  Performed by: Georga Hacking, MD Authorized by: Georga Hacking, MD     Interpretation: abnormal     ECG rate assessment: bradycardic     Rhythm: sinus bradycardia     Ectopy: none     Conduction: normal     The patient is on the cardiac monitor to evaluate for evidence of arrhythmia and/or significant heart rate changes.   MEDICATIONS ORDERED IN ED: Medications - No data to display   IMPRESSION / MDM / ASSESSMENT AND PLAN / ED COURSE  I reviewed the triage vital signs and the nursing notes.                              Patient's presentation is most consistent with acute presentation with potential threat to life or bodily function.  Differential diagnosis includes, but is not limited to, vasovagal response, less likely heart block or other unstable cardiac arrhythmia, symptomatic bradycardia  Is a 72 year old female who was having eyelid surgery today as an outpatient.  During procedure she received fentanyl Versed and propofol.  After procedure during recovery she was noted to have lightheadedness and was found to have heart rate in the 30s.  Per anesthesiology note on the monitor this was sinus  bradycardia.  Did have associated hypotension with blood pressures in the 80s over 60s.  She was confused but did not actually fully lose consciousness.  She received saline glycopyrrolate and eventually heart rates and blood pressure improved.  Currently she is asymptomatic no ongoing lightheadedness.  Denies any associated chest pain or dyspnea either during the episode or currently.  His  EKG shows sinus bradycardia with LVH and left axis but no acute ischemic changes no heart block.  Labs including CBC BMP and troponin are reassuring.  I reviewed patient's medication she is not on any AV nodal blockers.  I suspect that this was likely a vasovagal response especially given patient just had eye surgery this could certainly cause a vagal response.  I am reassured that the patient was noted to be in sinus rhythm at the time of the bradycardia and hypotension which makes a high degree AV block or other arrhythmia less likely.  Given patient is asymptomatic with otherwise reassuring EKG and workup I do think she is appropriate for discharge.  Will arrange for outpatient cardiology follow-up.      FINAL CLINICAL IMPRESSION(S) / ED DIAGNOSES   Final diagnoses:  Vaso vagal episode  Bradycardia     Rx / DC Orders   ED Discharge Orders          Ordered    Ambulatory referral to Cardiology       Comments: If you have not heard from the Cardiology office within the next 72 hours please call [phone].   12/08/22 1638             Note:  This document was prepared using Dragon voice recognition software and may include unintentional dictation errors.   Georga Hacking, MD 12/08/22 [phone]

## 2022-12-08 NOTE — H&P (Signed)
[facility]: [facility]  Primary Care Physician:  Nira Retort Ophthalmologist: Dr. Hubbard Robinson. Ether Griffins, M.D.  Pre-Procedure History & Physical: HPI:  Jackie French is a 72 y.o. female here for periocular surgery.   Past Medical History:  Diagnosis Date   Diabetes mellitus without complication (HCC)    Hypertension    Hypothyroidism    Thyroid disease     Past Surgical History:  Procedure Laterality Date   ABDOMINAL HYSTERECTOMY     THYROIDECTOMY  2021    Prior to Admission medications   Medication Sig Start Date End Date Taking? Authorizing Provider  amLODipine (NORVASC) 5 MG tablet Take 5 mg by mouth daily. 10/04/18  Yes [provider]  atorvastatin (LIPITOR) 20 MG tablet Take 20 mg by mouth Nightly. 09/24/18  Yes [provider]  cetirizine (ZYRTEC) 10 MG tablet Take 10 mg by mouth daily.   Yes [provider]  dapagliflozin propanediol (FARXIGA) 10 MG TABS tablet Take 10 mg by mouth daily.   Yes [provider]  fluticasone (FLONASE) 50 MCG/ACT nasal spray Place 1 spray into both nostrils daily.   Yes [provider]  Levothyroxine Sodium (SYNTHROID PO) Take by mouth.   Yes [provider]  losartan-hydrochlorothiazide (HYZAAR) 100-25 MG tablet Take 1 tablet by mouth daily.   Yes [provider]  metFORMIN (GLUCOPHAGE-XR) 500 MG 24 hr tablet Take 1,000 mg by mouth 2 (two) times daily. 07/16/18 12/08/22 Yes [provider]  montelukast (SINGULAIR) 10 MG tablet Take 10 mg by mouth Nightly.   Yes [provider]  ondansetron (ZOFRAN-ODT) 4 MG disintegrating tablet Take 1 tablet (4 mg total) by mouth every 8 (eight) hours as needed for nausea or vomiting. Patient not taking: Reported on 12/01/2022 12/10/21   Mickie Bail, NP    Allergies as of 11/02/2022 - Review Complete 12/10/2021  Allergen Reaction Noted   Penicillins Anaphylaxis 10/12/2018   Shellfish allergy Anaphylaxis and  Itching 03/04/2018    History reviewed. No pertinent family history.  Social History   Socioeconomic History   Marital status: Widowed    Spouse name: Not on file   Number of children: Not on file   Years of education: Not on file   Highest education level: Not on file  Occupational History   Not on file  Tobacco Use   Smoking status: Never   Smokeless tobacco: Never  Vaping Use   Vaping Use: Never used  Substance and Sexual Activity   Alcohol use: Yes    Comment: occasionally   Drug use: Never   Sexual activity: Not on file  Other Topics Concern   Not on file  Social History Narrative   Not on file   Social Determinants of Health   Financial Resource Strain: Not on file  Food Insecurity: Not on file  Transportation Needs: Not on file  Physical Activity: Not on file  Stress: Not on file  Social Connections: Not on file  Intimate Partner Violence: Not on file    Review of Systems: See HPI, otherwise negative ROS  Physical Exam: BP (!) 144/81   Pulse 63   Temp 98.1 F (36.7 C) (Temporal)   Resp 13   Ht 5\' 7"  (1.702 m)   Wt 95.3 kg   SpO2 99%   BMI 32.89 kg/m  General:   Alert and cooperative in NAD Head:  Normocephalic and atraumatic. Respiratory:  Normal work of breathing.  Impression/Plan: Jackie French is here for periocular surgery.  Risks, benefits, limitations, and alternatives regarding surgery have been reviewed with the patient.  Questions have been answered.  All parties agreeable.   Imagene Riches, MD  12/08/2022, 8:58 AM

## 2022-12-08 NOTE — Op Note (Signed)
Preoperative Diagnosis:   1.  Lower eyelid laxity with ectropion, bilateral  lower eyelid(s). 2.  Punctal eversion both  lower eyelid(s)  Postoperative Diagnosis:   Same.  Procedure(s) Performed:  1.  Lateral tarsal strip procedure,  bilateral   lower eyelid(s) 2.  Medial spindle (tarsal wedge excision) bilateral  lower eyelid(s)  Surgeon: Hubbard Robinson. Ether Griffins, M.D.  Assistants: none  Anesthesia: MAC  Specimens: None.  Estimated Blood Loss: Minimal.  Complications: None.  Operative Findings: None   Procedure:   Allergies were reviewed and the patient Penicillins and Shellfish allergy.    After discussing the risks, benefits, complications, and alternatives with the patient, appropriate informed consent was obtained. The patient was brought to the operating suite and reclined supine. Time out was conducted and the patient was sedated.  Local anesthetic consisting of a 50-50 mixture of 2% lidocaine with epinephrine and 0.75% bupivacaine with added Hylenex was injected subcutaneously to the bilateral  lateral canthal region(s) and lower eyelid(s). Additional anesthetic was injected subconjunctivally to the both  lower eyelid(s). Finally, anesthetic was injected down to the periosteum of the bilateral  lateral orbital rim(s).  After adequate local was instilled, the patient was prepped and draped in the usual sterile fashion for eyelid surgery. Attention was turned to the right  lateral canthal angle. Westcott scissors were used to create a lateral canthotomy. Hemostasis was obtained with bipolar cautery. An inferior cantholysis was then performed with additional bipolar hemostasis. The anterior and posterior lamella of the lid were divided for approximately 8 mm.  A strip of the epithelium was excised off the superior margin of the tarsal strip and conjunctiva and retractors were incised off the inferior margin of the tarsal strip.  Attention was then turned to the medial portion of the lid.  An ellipse of conjunctiva, retractors and inferior tarsal margin was excised inferior to the punctum. Hemostasis was obtained with bipolar cautery. A double-armed 4-0 chromic suture was then passed through the tarsal edge first then the conjunctival and retractor borders and finally from posterior to anterior through the skin and tied off. This provided nice inward rotation of the lower eyelid medially   A double-armed 4-0 Mersilene suture was then passed each arm through the terminal portion of the tarsal strip. Each arm of the suture was then passed through the periosteum of the inner portion of the lateral orbital rim at the level of Whitnall's tubercle. The sutures were advanced and this provided nice elevation and tightening of the lower eyelid. Once the suture was secured, a thin strip of follicle-bearing skin was excised. The lateral canthal angle was reformed with an interrupted 6-0 plain gut suture. Orbicularis was reapproximated with horizontal subcuticular 6-0  plain gut sutures. The skin was closed with interrupted 6-0 plain gut sutures.   Attention was then turned to the opposite eyelid where the same procedure was performed in the same manner.   The patient tolerated the procedure well. Erythromycin ophthalmic ointment was applied to the incision site(s) followed by ice packs. The patient was taken to the recovery area where she recovered without difficulty.  Post-Op Plan/Instructions:  The patient was instructed to use ice packs frequently for the next 48 hours. She was instructed to use Erythromycin ophthalmic ointment on her incisions 4 times a day for the next 12 to 14 days. She was given a prescription for tramadol (or similar) for pain control should Tylenol not be effective. She was asked to to follow up at the [facility]  in [place], [place] in 2-3 weeks' time or sooner as needed for problems.  Darreld Hoffer M. Ether Griffins, M.D. Ophthalmology

## 2022-12-08 NOTE — ED Notes (Signed)
Lab called for blood draw in triage.,

## 2022-12-08 NOTE — Interval H&P Note (Signed)
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  The various methods of treatment have been discussed with the patient and family. After consideration of risks, benefits and other options for treatment, the patient has consented to  Procedure(s): ECTROPION REPAIR, TARSAL WEDGE ECTROPION REPAIR, EXTENSIVE BILATERAL (Bilateral) as a surgical intervention.  The patient's history has been reviewed, patient examined, no change in status, stable for surgery.  I have reviewed the patient's chart and labs.  Questions were answered to the patient's satisfaction.     Ether Griffins, Jaeshawn Silvio M

## 2022-12-08 NOTE — ED Triage Notes (Signed)
Pt to ED from outpatient surgery in [place] where pt was getting bilateral lower eyelid surgery and when being moved to the post op area, BP and HR dropped to 90s/50s and HR 30s. Staff called [facility] who brought pt here. Pt had stable VS in ambulance, EKG here shows SB with HR of 59. Pt in no distress, denies pain, respirations unlabored. Pt has 22# to R hand with IVF received PTA.

## 2022-12-08 NOTE — ED Notes (Signed)
To and from [facility]. Steady gait, no changes. EDP at [facility]. Daughter at [facility].

## 2022-12-08 NOTE — Anesthesia Preprocedure Evaluation (Addendum)
Anesthesia Evaluation  Patient identified by MRN, date of birth, ID band Patient awake    Reviewed: Allergy & Precautions, H&P , NPO status , Patient's Chart, lab work & pertinent test results  Airway Mallampati: I  TM Distance: >3 FB Neck ROM: Full    Dental no notable dental hx.    Pulmonary neg pulmonary ROS   Pulmonary exam normal breath sounds clear to auscultation       Cardiovascular hypertension, Pt. on medications Normal cardiovascular exam Rhythm:Regular Rate:Normal     Neuro/Psych negative neurological ROS  negative psych ROS   GI/Hepatic negative GI ROS, Neg liver ROS,,,  Endo/Other  diabetes, Type 2, Oral Hypoglycemic AgentsHypothyroidism    Renal/GU negative Renal ROS  negative genitourinary   Musculoskeletal negative musculoskeletal ROS (+)    Abdominal   Peds negative pediatric ROS (+)  Hematology  (+) Blood dyscrasia, anemia Iron deficient   Anesthesia Other Findings Diabetes mellitus without complication (HCC) Hypertension Thyroid disease  Hypothyroidism Essential hypertension Seasonal allergic rhinitis due to pollen Asthma, intermittent, uncomplicated Obesity (BMI 30.0-34.9), unspecified Type 2 diabetes mellitus with hyperlipidemia (A1c 8.8% - 07/18/22) Hyperlipidemia associated with type 2 diabetes mellitus (LDL 56 - 07/18/22)) Multinodular goiter 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, postsurgical (TSH 0.3 - 07/18/22) B12 deficiency (328 - 07/18/22) Iron deficiency anemia secondary to inadequate dietary iron intake (iron 62 - 07/18/22) Medicare annual wellness visit, subsequent 07/25/22     Reproductive/Obstetrics negative OB ROS                              Anesthesia Physical Anesthesia Plan  ASA: 2  Anesthesia Plan: MAC   Post-op Pain Management:    Induction: Intravenous  PONV Risk Score and Plan:   Airway Management Planned:  Natural Airway and Nasal Cannula  Additional Equipment:   Intra-op Plan:   Post-operative Plan:   Informed Consent: I have reviewed the patients History and Physical, chart, labs and discussed the procedure including the risks, benefits and alternatives for the proposed anesthesia with the patient or authorized representative who has indicated his/her understanding and acceptance.     Dental Advisory Given  Plan Discussed with: Anesthesiologist, CRNA and Surgeon  Anesthesia Plan Comments: (Patient consented for risks of anesthesia including but not limited to:  - adverse reactions to medications - damage to eyes, teeth, lips or other oral mucosa - nerve damage due to positioning  - sore throat or hoarseness - Damage to heart, brain, nerves, lungs, other parts of body or loss of life  Patient voiced understanding.)       Anesthesia Quick Evaluation

## 2022-12-11 ENCOUNTER — Encounter: Payer: Self-pay | Admitting: Ophthalmology

## 2023-01-30 ENCOUNTER — Ambulatory Visit: Payer: Medicare Other | Attending: Cardiology | Admitting: Cardiology

## 2023-01-30 ENCOUNTER — Ambulatory Visit (INDEPENDENT_AMBULATORY_CARE_PROVIDER_SITE_OTHER): Payer: Medicare Other

## 2023-01-30 ENCOUNTER — Encounter: Payer: Self-pay | Admitting: Cardiology

## 2023-01-30 VITALS — BP 114/68 | HR 67 | Ht 67.0 in | Wt 214.2 lb

## 2023-01-30 DIAGNOSIS — R55 Syncope and collapse: Secondary | ICD-10-CM | POA: Diagnosis present

## 2023-01-30 DIAGNOSIS — E782 Mixed hyperlipidemia: Secondary | ICD-10-CM

## 2023-01-30 DIAGNOSIS — E119 Type 2 diabetes mellitus without complications: Secondary | ICD-10-CM

## 2023-01-30 DIAGNOSIS — I10 Essential (primary) hypertension: Secondary | ICD-10-CM

## 2023-01-30 DIAGNOSIS — Z7984 Long term (current) use of oral hypoglycemic drugs: Secondary | ICD-10-CM

## 2023-01-30 NOTE — Progress Notes (Signed)
Cardiology Office Note:    Date:  01/30/2023   ID:  Jackie French, DOB [birth year], MRN [id number]  PCP:  Marisue Ivan, MD  Cardiologist:  Thomasene Ripple, DO  Electrophysiologist:  None   Referring MD: Marisue Ivan, MD   " I passed out after a procedure"  History of Present Illness:    Jackie French is a 72 y.o. female with a hx of diabetes mellitus, obesity, hypertension, hyperlipidemia, hypothyroidism here today to establish cardiac care.  She was asked to see cardiology after experiencing syncope episode postprocedure.  The day of her episode she tells me that she had a procedure done which per epic was described as ectropion repair, tarsal wedge.   She was seen at the emergency at [facility].  She was noted to be hypotensive and bradycardic.  She is here with her daughter today.  She tells me since her episode on May 17 she has been doing well.  She has not had any repeated syncope episode.  She denies any chest pain or shortness of breath.    Of note she lives in [place] but is a [place] with her family frequently.  She has never had a cardiovascular evaluation prior.  Past Medical History:  Diagnosis Date   Asthma, intermittent, uncomplicated    Diabetes mellitus without complication (HCC)    Hypertension    Hypothyroidism    Thyroid disease     Past Surgical History:  Procedure Laterality Date   ABDOMINAL HYSTERECTOMY     ECTROPION REPAIR Bilateral 12/08/2022   Procedure: ECTROPION REPAIR, TARSAL WEDGE ECTROPION REPAIR, EXTENSIVE BILATERAL;  Surgeon: Imagene Riches, MD;  Location: [facility];  Service: Ophthalmology;  Laterality: Bilateral;   THYROIDECTOMY  2021    Current Medications: Current Meds  Medication Sig   Accu-Chek Softclix Lancets lancets daily.   amLODipine (NORVASC) 5 MG tablet Take 5 mg by mouth daily.   atorvastatin (LIPITOR) 20 MG tablet Take 20 mg by mouth Nightly.   Blood Glucose Monitoring Suppl (ONE TOUCH ULTRA 2)  w/Device KIT daily.   cetirizine (ZYRTEC) 10 MG tablet Take 10 mg by mouth daily.   Cysteamine Bitartrate (PROCYSBI) 300 MG PACK Check blood sugar once daily fasting. Dx E11.9 ONE TOUCH   dapagliflozin propanediol (FARXIGA) 10 MG TABS tablet Take 10 mg by mouth daily.   erythromycin ophthalmic ointment Apply to sutures 4 times a day for 10-12 days.  Discontinue if allergy develops and call our office   fluticasone (FLONASE) 50 MCG/ACT nasal spray Place 1 spray into both nostrils daily.   Levothyroxine Sodium (SYNTHROID PO) Take 100 mg by mouth daily.   losartan-hydrochlorothiazide (HYZAAR) 100-25 MG tablet Take 1 tablet by mouth daily.   montelukast (SINGULAIR) 10 MG tablet Take 10 mg by mouth Nightly.   ondansetron (ZOFRAN-ODT) 4 MG disintegrating tablet Take 1 tablet (4 mg total) by mouth every 8 (eight) hours as needed for nausea or vomiting.   ONETOUCH ULTRA test strip CHECK BLOOD SUGAR ONCE DAILY FASTING. DX E11.9 ONE TOUCH     Allergies:   Penicillins and Shellfish allergy   Social History   Socioeconomic History   Marital status: Widowed    Spouse name: Not on file   Number of children: Not on file   Years of education: Not on file   Highest education level: Not on file  Occupational History   Not on file  Tobacco Use   Smoking status: Never   Smokeless tobacco: Never  Vaping Use  Vaping Use: Never used  Substance and Sexual Activity   Alcohol use: Yes    Comment: occasionally   Drug use: Never   Sexual activity: Not on file  Other Topics Concern   Not on file  Social History Narrative   Not on file   Social Determinants of Health   Financial Resource Strain: Not on file  Food Insecurity: Not on file  Transportation Needs: Not on file  Physical Activity: Not on file  Stress: Not on file  Social Connections: Not on file     Family History: The patient's family history is not on file.  ROS:   Review of Systems  Constitution: Negative for decreased  appetite, fever and weight gain.  HENT: Negative for congestion, ear discharge, hoarse voice and sore throat.   Eyes: Negative for discharge, redness, vision loss in right eye and visual halos.  Cardiovascular: Recent syncope.  Negative for chest pain, dyspnea on exertion, leg swelling, orthopnea and palpitations.  Respiratory: Negative for cough, hemoptysis, shortness of breath and snoring.   Endocrine: Negative for heat intolerance and polyphagia.  Hematologic/Lymphatic: Negative for bleeding problem. Does not bruise/bleed easily.  Skin: Negative for flushing, nail changes, rash and suspicious lesions.  Musculoskeletal: Negative for arthritis, joint pain, muscle cramps, myalgias, neck pain and stiffness.  Gastrointestinal: Negative for abdominal pain, bowel incontinence, diarrhea and excessive appetite.  Genitourinary: Negative for decreased libido, genital sores and incomplete emptying.  Neurological: Negative for brief paralysis, focal weakness, headaches and loss of balance.  Psychiatric/Behavioral: Negative for altered mental status, depression and suicidal ideas.  Allergic/Immunologic: Negative for HIV exposure and persistent infections.    EKGs/Labs/Other Studies Reviewed:    The following studies were reviewed today:   EKG:  The ekg ordered today demonstrates   Recent Labs: 12/08/2022: BUN 13; Creatinine, Ser 0.85; Hemoglobin 12.7; Platelets 309; Potassium 3.7; Sodium 137  Recent Lipid Panel No results found for: "CHOL", "TRIG", "HDL", "CHOLHDL", "VLDL", "LDLCALC", "LDLDIRECT"  Physical Exam:    VS:  BP 114/68 (BP Location: Left Arm, Patient Position: Sitting, Cuff Size: Normal)   Pulse 67   Ht 5\' 7"  (1.702 m)   Wt 214 lb 3.2 oz (97.2 kg)   SpO2 96%   BMI 33.55 kg/m     Wt Readings from Last 3 Encounters:  01/30/23 214 lb 3.2 oz (97.2 kg)  12/08/22 209 lb 7 oz (95 kg)  12/08/22 210 lb (95.3 kg)     GEN: Well nourished, well developed in no acute distress HEENT:  Normal NECK: No JVD; No carotid bruits LYMPHATICS: No lymphadenopathy CARDIAC: S1S2 noted,RRR, 3/6 midsystolic ejection murmurs, rubs, gallops RESPIRATORY:  Clear to auscultation without rales, wheezing or rhonchi  ABDOMEN: Soft, non-tender, non-distended, +bowel sounds, no guarding. EXTREMITIES: No edema, No cyanosis, no clubbing MUSCULOSKELETAL:  No deformity  SKIN: Warm and dry NEUROLOGIC:  Alert and oriented x 3, non-focal PSYCHIATRIC:  Normal affect, good insight  ASSESSMENT:    1. Syncope and collapse   2. Morbid obesity (HCC)   3. Primary hypertension   4. Mixed hyperlipidemia   5. Non-insulin dependent type 2 diabetes mellitus (HCC)    PLAN:     I would like to rule out a cardiovascular etiology of this syncope, therefore at this time I would like to placed a zio patch for 14  days. In additon a transthoracic echocardiogram will be ordered to assess LV/RV function and any structural abnormalities. Once these testing have been performed amd reviewed further reccomendations will be made.  For now, I do reccomend that the patient goes to the nearest ED if  symptoms recur.  The patient is in agreement with the above plan. The patient left the office in stable condition.  The patient will follow up in 3 weeks virtually.   Medication Adjustments/Labs and Tests Ordered: Current medicines are reviewed at length with the patient today.  Concerns regarding medicines are outlined above.  Orders Placed This Encounter  Procedures   LONG TERM MONITOR (3-14 DAYS)   ECHOCARDIOGRAM COMPLETE   No orders of the defined types were placed in this encounter.   Patient Instructions  Medication Instructions:  Your physician recommends that you continue on your current medications as directed. Please refer to the Current Medication list given to you today.  *If you need a refill on your cardiac medications before your next appointment, please call your pharmacy*   Lab  Work: None   Testing/Procedures: Your physician has requested that you have an echocardiogram. Echocardiography is a painless test that uses sound waves to create images of your heart. It provides your doctor with information about the size and shape of your heart and how well your heart's chambers and valves are working. This procedure takes approximately one hour. There are no restrictions for this procedure. Please do NOT wear cologne, perfume, aftershave, or lotions (deodorant is allowed). Please arrive 15 minutes prior to your appointment time.  ZIO XT- Long Term Monitor Instructions  Your physician has requested you wear a ZIO patch monitor for 14 days.  This is a single patch monitor. [employer] supplies one patch monitor per enrollment. Additional stickers are not available. Please do not apply patch if you will be having a Nuclear Stress Test,  Echocardiogram, Cardiac CT, MRI, or Chest Xray during the period you would be wearing the  monitor. The patch cannot be worn during these tests. You cannot remove and re-apply the  ZIO XT patch monitor.  Your ZIO patch monitor will be mailed 3 day USPS to your address on file. It may take 3-5 days  to receive your monitor after you have been enrolled.  Once you have received your monitor, please review the enclosed instructions. Your monitor  has already been registered assigning a specific monitor serial # to you.  Billing and Patient Assistance Program Information  We have supplied [employer] with any of your insurance information on file for billing purposes. [employer] offers a sliding scale Patient Assistance Program for patients that do not have  insurance, or whose insurance does not completely cover the cost of the ZIO monitor.  You must apply for the Patient Assistance Program to qualify for this discounted rate.  To apply, please call [employer] at [phone], select option 4, select option 2, ask to apply for  Patient Assistance Program.  Meredeth Ide will ask your household income, and how many people  are in your household. They will quote your out-of-pocket cost based on that information.  [employer] will also be able to set up a 62-month, interest-free payment plan if needed.  Applying the monitor   Shave hair from upper left chest.  Hold abrader disc by orange tab. Rub abrader in 40 strokes over the upper left chest as  indicated in your monitor instructions.  Clean area with 4 enclosed alcohol pads. Let dry.  Apply patch as indicated in monitor instructions. Patch will be placed under collarbone on left  side of chest with arrow pointing upward.  Rub patch adhesive wings for 2 minutes. Remove white  label marked "1". Remove the white  label marked "2". Rub patch adhesive wings for 2 additional minutes.  While looking in a mirror, press and release button in center of patch. A small green light will  flash 3-4 times. This will be your only indicator that the monitor has been turned on.  Do not shower for the first 24 hours. You may shower after the first 24 hours.  Press the button if you feel a symptom. You will hear a small click. Record Date, Time and  Symptom in the Patient Logbook.  When you are ready to remove the patch, follow instructions on the last 2 pages of Patient  Logbook. Stick patch monitor onto the last page of Patient Logbook.  Place Patient Logbook in the blue and white box. Use locking tab on box and tape box closed  securely. The blue and white box has prepaid postage on it. Please place it in the mailbox as  soon as possible. Your physician should have your test results approximately 7 days after the  monitor has been mailed back to [facility].  Call [facility] Customer Care at [phone] if you have questions regarding  your ZIO XT patch monitor. Call them immediately if you see an orange light blinking on your  monitor.  If your monitor falls off in less than 4 days, contact our Monitor  department at [phone].  If your monitor becomes loose or falls off after 4 days call [employer] at [phone] for  suggestions on securing your monitor    Follow-Up: At [facility], you and your health needs are our priority.  As part of our continuing mission to provide you with exceptional heart care, we have created designated Provider Care Teams.  These Care Teams include your primary Cardiologist (physician) and Advanced Practice Providers (APPs -  Physician Assistants and Nurse Practitioners) who all work together to provide you with the care you need, when you need it.   Your next appointment:   3 week(s) via MyChart  Provider:   Thomasene Ripple, DO       Adopting a Healthy Lifestyle.  Know what a healthy weight is for you (roughly BMI <25) and aim to maintain this   Aim for 7+ servings of fruits and vegetables daily   65-80+ fluid ounces of water or unsweet tea for healthy kidneys   Limit to max 1 drink of alcohol per day; avoid smoking/tobacco   Limit animal fats in diet for cholesterol and heart health - choose grass fed whenever available   Avoid highly processed foods, and foods high in saturated/trans fats   Aim for low stress - take time to unwind and care for your mental health   Aim for 150 min of moderate intensity exercise weekly for heart health, and weights twice weekly for bone health   Aim for 7-9 hours of sleep daily   When it comes to diets, agreement about the perfect plan isnt easy to find, even among the experts. Experts at the [facility] [employer] developed an idea known as the Healthy Eating Plate. Just imagine a plate divided into logical, healthy portions.   The emphasis is on diet quality:   Load up on vegetables and fruits - one-half of your plate: Aim for color and variety, and remember that potatoes dont count.   Go for whole grains - one-quarter of your plate: Whole wheat, barley, wheat berries, quinoa, oats,  brown rice, and foods made with them. If  you want pasta, go with whole wheat pasta.   Protein power - one-quarter of your plate: Fish, chicken, beans, and nuts are all healthy, versatile protein sources. Limit red meat.   The diet, however, does go beyond the plate, offering a few other suggestions.   Use healthy plant oils, such as olive, canola, soy, corn, sunflower and peanut. Check the labels, and avoid partially hydrogenated oil, which have unhealthy trans fats.   If youre thirsty, drink water. Coffee and tea are good in moderation, but skip sugary drinks and limit milk and dairy products to one or two daily servings.   The type of carbohydrate in the diet is more important than the amount. Some sources of carbohydrates, such as vegetables, fruits, whole grains, and beans-are healthier than others.   Finally, stay active  Signed, Thomasene Ripple, DO  01/30/2023 4:51 PM    [facility]

## 2023-01-30 NOTE — Progress Notes (Unsigned)
Enrolled for [employer] to mail a ZIO XT long term holter monitor to the patients address on file.  

## 2023-01-30 NOTE — Patient Instructions (Signed)
Medication Instructions:  Your physician recommends that you continue on your current medications as directed. Please refer to the Current Medication list given to you today.  *If you need a refill on your cardiac medications before your next appointment, please call your pharmacy*   Lab Work: None   Testing/Procedures: Your physician has requested that you have an echocardiogram. Echocardiography is a painless test that uses sound waves to create images of your heart. It provides your doctor with information about the size and shape of your heart and how well your heart's chambers and valves are working. This procedure takes approximately one hour. There are no restrictions for this procedure. Please do NOT wear cologne, perfume, aftershave, or lotions (deodorant is allowed). Please arrive 15 minutes prior to your appointment time.  ZIO XT- Long Term Monitor Instructions  Your physician has requested you wear a ZIO patch monitor for 14 days.  This is a single patch monitor. [employer] supplies one patch monitor per enrollment. Additional stickers are not available. Please do not apply patch if you will be having a Nuclear Stress Test,  Echocardiogram, Cardiac CT, MRI, or Chest Xray during the period you would be wearing the  monitor. The patch cannot be worn during these tests. You cannot remove and re-apply the  ZIO XT patch monitor.  Your ZIO patch monitor will be mailed 3 day USPS to your address on file. It may take 3-5 days  to receive your monitor after you have been enrolled.  Once you have received your monitor, please review the enclosed instructions. Your monitor  has already been registered assigning a specific monitor serial # to you.  Billing and Patient Assistance Program Information  We have supplied [employer] with any of your insurance information on file for billing purposes. [employer] offers a sliding scale Patient Assistance Program for patients that do not have   insurance, or whose insurance does not completely cover the cost of the ZIO monitor.  You must apply for the Patient Assistance Program to qualify for this discounted rate.  To apply, please call [employer] at [phone], select option 4, select option 2, ask to apply for  Patient Assistance Program. Meredeth Ide will ask your household income, and how many people  are in your household. They will quote your out-of-pocket cost based on that information.  [employer] will also be able to set up a 79-month, interest-free payment plan if needed.  Applying the monitor   Shave hair from upper left chest.  Hold abrader disc by orange tab. Rub abrader in 40 strokes over the upper left chest as  indicated in your monitor instructions.  Clean area with 4 enclosed alcohol pads. Let dry.  Apply patch as indicated in monitor instructions. Patch will be placed under collarbone on left  side of chest with arrow pointing upward.  Rub patch adhesive wings for 2 minutes. Remove white label marked "1". Remove the white  label marked "2". Rub patch adhesive wings for 2 additional minutes.  While looking in a mirror, press and release button in center of patch. A small green light will  flash 3-4 times. This will be your only indicator that the monitor has been turned on.  Do not shower for the first 24 hours. You may shower after the first 24 hours.  Press the button if you feel a symptom. You will hear a small click. Record Date, Time and  Symptom in the Patient Logbook.  When you are ready to remove the patch, follow  instructions on the last 2 pages of Patient  Logbook. Stick patch monitor onto the last page of Patient Logbook.  Place Patient Logbook in the blue and white box. Use locking tab on box and tape box closed  securely. The blue and white box has prepaid postage on it. Please place it in the mailbox as  soon as possible. Your physician should have your test results approximately 7 days after the  monitor  has been mailed back to [facility].  Call [facility] Customer Care at [phone] if you have questions regarding  your ZIO XT patch monitor. Call them immediately if you see an orange light blinking on your  monitor.  If your monitor falls off in less than 4 days, contact our Monitor department at [phone].  If your monitor becomes loose or falls off after 4 days call [employer] at [phone] for  suggestions on securing your monitor    Follow-Up: At [facility], you and your health needs are our priority.  As part of our continuing mission to provide you with exceptional heart care, we have created designated Provider Care Teams.  These Care Teams include your primary Cardiologist (physician) and Advanced Practice Providers (APPs -  Physician Assistants and Nurse Practitioners) who all work together to provide you with the care you need, when you need it.   Your next appointment:   3 week(s) via MyChart  Provider:   Thomasene Ripple, DO

## 2023-01-31 ENCOUNTER — Ambulatory Visit (HOSPITAL_BASED_OUTPATIENT_CLINIC_OR_DEPARTMENT_OTHER)
Admission: RE | Admit: 2023-01-31 | Discharge: 2023-01-31 | Disposition: A | Discharge status: Home / Self Care | Payer: Medicare Other | Source: Ambulatory Visit | Attending: Cardiology | Admitting: Cardiology

## 2023-01-31 DIAGNOSIS — R55 Syncope and collapse: Secondary | ICD-10-CM

## 2023-02-03 DIAGNOSIS — R55 Syncope and collapse: Secondary | ICD-10-CM

## 2023-02-05 LAB — ECHOCARDIOGRAM COMPLETE
AR max vel: 1.37 cm2
AV Area VTI: 1.51 cm2
AV Area mean vel: 1.47 cm2
AV Mean grad: 9 mmHg
AV Peak grad: 14.4 mmHg
Ao pk vel: 1.9 m/s
Area-P 1/2: 2.8 cm2
P 1/2 time: 482 msec
S' Lateral: 2.4 cm

## 2023-02-21 ENCOUNTER — Telehealth: Payer: Self-pay

## 2023-02-21 ENCOUNTER — Encounter: Payer: Self-pay | Admitting: Cardiology

## 2023-02-21 ENCOUNTER — Ambulatory Visit: Payer: Medicare Other | Admitting: Cardiology

## 2023-02-21 NOTE — Telephone Encounter (Signed)
Called pt to reschedule her appt from today. No answer, left message for her to return call.

## 2023-02-26 ENCOUNTER — Telehealth: Payer: Self-pay | Admitting: Cardiology

## 2023-02-26 NOTE — Telephone Encounter (Signed)
New Message:     Patient said her last appointment was cx by Dr Mallory Shirk office. It was A Virtual visit. Patient wants to know if Dr Servando Salina wants to reschedule that Virtual visit or is it something she can come in later for? She said she will be intown after August 21st. If it is important she said, Dr Servando Salina can do the Virtual visit please.

## 2023-02-27 NOTE — Telephone Encounter (Signed)
This was a 3 week follow up appt. Would you like to see her before or after goes out of town?

## 2023-03-13 NOTE — Telephone Encounter (Signed)
Called pt again. No answer, left another message.

## 2023-03-13 NOTE — Telephone Encounter (Signed)
Called pt to set up appt for 8/22 at 8am or 9:20am. No answer. Left message from her to return the call to confirm.

## 2023-03-14 NOTE — Telephone Encounter (Signed)
Called pt, no answer. Unable to set up appointment for 8/22.

## 2023-03-19 ENCOUNTER — Telehealth: Payer: Self-pay | Admitting: Cardiology

## 2023-03-19 NOTE — Telephone Encounter (Signed)
Patient following-up to schedule virtual visit with Dr. Servando Salina.

## 2023-03-23 ENCOUNTER — Telehealth: Payer: Self-pay | Admitting: Cardiology

## 2023-03-23 NOTE — Telephone Encounter (Signed)
Pt called back. She does not want to wait until 9/23 to go over monitor results, she would like to go over prior to her appt. Please advise.

## 2023-03-23 NOTE — Telephone Encounter (Signed)
Left voicemail to return call to office.

## 2023-03-23 NOTE — Telephone Encounter (Signed)
Patient would like her results to her long term monitor.

## 2023-03-23 NOTE — Telephone Encounter (Signed)
Returned pt call and LVM that her message will be sent to the provider.

## 2023-04-16 ENCOUNTER — Encounter: Payer: Self-pay | Admitting: Cardiology

## 2023-04-16 ENCOUNTER — Ambulatory Visit: Payer: Medicare Other | Attending: Cardiology | Admitting: Cardiology

## 2023-04-16 VITALS — Ht 67.0 in | Wt 208.0 lb

## 2023-04-16 DIAGNOSIS — I471 Supraventricular tachycardia, unspecified: Secondary | ICD-10-CM | POA: Diagnosis present

## 2023-04-16 DIAGNOSIS — E782 Mixed hyperlipidemia: Secondary | ICD-10-CM | POA: Diagnosis not present

## 2023-04-16 DIAGNOSIS — I10 Essential (primary) hypertension: Secondary | ICD-10-CM | POA: Insufficient documentation

## 2023-04-16 NOTE — Progress Notes (Addendum)
Virtual Visit via Video Note   Because of Jackie French's co-morbid illnesses, she is at least at moderate risk for complications without adequate follow up.  This format is felt to be most appropriate for this patient at this time.  All issues noted in this document were discussed and addressed.  A limited physical exam was performed with this format.  Please refer to the patient's chart for her consent to telehealth for [facility].    Date:  04/17/2023   ID:  Jackie French, DOB 11-Jul-1951, MRN [id number]  Patient Location: Home Provider Location: Office/Clinic  PCP:  Marisue Ivan, MD  Cardiologist:  Thomasene Ripple, DO  Electrophysiologist:  None   Evaluation Performed:  Follow-Up Visit  Chief Complaint:  " I am doing well" She is at home. I am in the office.   Virtual Visit via Video  Note . I connected with the patient today by a   video enabled telemedicine application and verified that I am speaking with the correct person using two identifiers.  Televisit performed on 04/16/2023  History of Present Illness:    Jackie French is a 72 y.o. female with  hx of diabetes mellitus, obesity, hypertension, hyperlipidemia, hypothyroidism here today for a follow up visit.  She reports a previous episode of significant pain, which was attributed to muscle spasms after extensive testing revealed no other cause. She denies any chest pain at this time.  She reports feeling generally well, with no significant palpitations or symptoms of syncope. She also mentions a previous history of dizziness.  The patient is currently located in [place], away from her usual residence in [place].  The patient does not have symptoms concerning for COVID-19 infection (fever, chills, cough, or new shortness of breath).    Past Medical History:  Diagnosis Date   Asthma, intermittent, uncomplicated    Diabetes mellitus without complication (HCC)    Hypertension    Hypothyroidism     Thyroid disease    Past Surgical History:  Procedure Laterality Date   ABDOMINAL HYSTERECTOMY     ECTROPION REPAIR Bilateral 12/08/2022   Procedure: ECTROPION REPAIR, TARSAL WEDGE ECTROPION REPAIR, EXTENSIVE BILATERAL;  Surgeon: Imagene Riches, MD;  Location: [facility];  Service: Ophthalmology;  Laterality: Bilateral;   THYROIDECTOMY  2021     Current Meds  Medication Sig   Accu-Chek Softclix Lancets lancets daily.   amLODipine (NORVASC) 5 MG tablet Take 5 mg by mouth daily.   atorvastatin (LIPITOR) 20 MG tablet Take 20 mg by mouth Nightly.   Blood Glucose Monitoring Suppl (ONE TOUCH ULTRA 2) w/Device KIT daily.   cetirizine (ZYRTEC) 10 MG tablet Take 10 mg by mouth daily.   dapagliflozin propanediol (FARXIGA) 10 MG TABS tablet Take 10 mg by mouth daily.   fluticasone (FLONASE) 50 MCG/ACT nasal spray Place 1 spray into both nostrils daily.   Levothyroxine Sodium (SYNTHROID PO) Take 100 mg by mouth daily.   losartan-hydrochlorothiazide (HYZAAR) 100-25 MG tablet Take 1 tablet by mouth daily.   montelukast (SINGULAIR) 10 MG tablet Take 10 mg by mouth Nightly.     Allergies:   Penicillins and Shellfish allergy   Social History   Tobacco Use   Smoking status: Never   Smokeless tobacco: Never  Vaping Use   Vaping status: Never Used  Substance Use Topics   Alcohol use: Yes    Comment: occasionally   Drug use: Never     Family Hx: The patient's family history is not  on file.  ROS:   Please see the history of present illness.     All other systems reviewed and are negative.   Prior CV studies:   The following studies were reviewed today:  Zio and echo   Labs/Other Tests and Data Reviewed:    EKG:  none today   Recent Labs: 12/08/2022: BUN 13; Creatinine, Ser 0.85; Hemoglobin 12.7; Platelets 309; Potassium 3.7; Sodium 137   Recent Lipid Panel No results found for: "CHOL", "TRIG", "HDL", "CHOLHDL", "LDLCALC", "LDLDIRECT"  Wt Readings from Last 3  Encounters:  04/16/23 208 lb (94.3 kg)  01/30/23 214 lb 3.2 oz (97.2 kg)  12/08/22 209 lb 7 oz (95 kg)     Objective:    Vital Signs:  Ht 5\' 7"  (1.702 m)   Wt 208 lb (94.3 kg)   BMI 32.58 kg/m     ASSESSMENT & PLAN:    Echocardiogram showed normal heart function and valves, but evidence of diastolic dysfunction likely due to age and history of hypertension. No further testing required at this time. -Continue current management.  Paroxysmal Supraventricular tachycardis Monitor showed occasional episodes of rapid heart rate, with a maximum of 167 beats per minute. Patient reports feeling okay and does not experience significant palpitations or pre-syncope. -No medication changes at this time. -Continue monitoring symptoms and report any changes.  Dizziness Patient reports dizziness and is seeking evaluation from an ENT specialist. -Continue with ENT appointment as planned.  General Health Maintenance / Followup Plans: -Send report to Dr. Palma Holter in [place] (fax [phone]). -Schedule annual follow-up appointment, sooner if any issues arise.  COVID-19 Education: The signs and symptoms of COVID-19 were discussed with the patient and how to seek care for testing (follow up with PCP or arrange E-visit).  The importance of social distancing was discussed today.  Time:   Today, I have spent 15 minutes with the patient with telehealth technology discussing the above problems.     Medication Adjustments/Labs and Tests Ordered: Current medicines are reviewed at length with the patient today.  Concerns regarding medicines are outlined above.   Tests Ordered: No orders of the defined types were placed in this encounter.   Medication Changes: No orders of the defined types were placed in this encounter.   Follow Up:  In Person in 1 year(s)  Signed, Thomasene Ripple, DO  04/17/2023 2:03 PM    [facility]

## 2023-04-16 NOTE — Patient Instructions (Signed)
Medication Instructions:  Your physician recommends that you continue on your current medications as directed. Please refer to the Current Medication list given to you today.  *If you need a refill on your cardiac medications before your next appointment, please call your pharmacy*   Lab Work: None   Testing/Procedures: None   Follow-Up: At [facility], you and your health needs are our priority.  As part of our continuing mission to provide you with exceptional heart care, we have created designated Provider Care Teams.  These Care Teams include your primary Cardiologist (physician) and Advanced Practice Providers (APPs -  Physician Assistants and Nurse Practitioners) who all work together to provide you with the care you need, when you need it.   Your next appointment:   1 year(s)  Provider:   Kardie Tobb, DO   

## 2023-07-15 ENCOUNTER — Ambulatory Visit
Admission: EM | Admit: 2023-07-15 | Discharge: 2023-07-15 | Disposition: A | Discharge status: Home / Self Care | Payer: Medicare Other

## 2023-07-15 DIAGNOSIS — J4521 Mild intermittent asthma with (acute) exacerbation: Secondary | ICD-10-CM | POA: Diagnosis not present

## 2023-07-15 DIAGNOSIS — J069 Acute upper respiratory infection, unspecified: Secondary | ICD-10-CM

## 2023-07-15 MED ORDER — BENZONATATE 100 MG PO CAPS: 100.0000 mg | ORAL_CAPSULE | Freq: Three times a day (TID) | ORAL | 0 refills | Status: AC

## 2023-07-15 MED ORDER — AZITHROMYCIN 250 MG PO TABS: 250.0000 mg | ORAL_TABLET | Freq: Every day | ORAL | 0 refills | Status: AC

## 2023-07-15 MED ORDER — PREDNISONE 20 MG PO TABS: 40.0000 mg | ORAL_TABLET | Freq: Every day | ORAL | 0 refills | Status: AC

## 2023-07-15 NOTE — ED Triage Notes (Signed)
Congestion, cough, ear pain, sore throat  x 5 days. Taking tylenol.

## 2023-07-15 NOTE — Discharge Instructions (Signed)
Your symptoms today are most likely being caused by a virus and should steadily improve in time it can take up to 7 to 10 days before you truly start to see a turnaround however things will get better  Begin azithromycin prophylactically which will prevent symptoms from worsening to more serious respiratory condition such as pneumonia  Begin prednisone every morning with food to open and relax the airway should make it easier to breathe and help settle wheezing  You may use Tessalon pill every 8 hours as needed to help calm coughing    You can take Tylenol and/or Ibuprofen as needed for fever reduction and pain relief.   For cough: honey 1/2 to 1 teaspoon (you can dilute the honey in water or another fluid).  You can also use guaifenesin and dextromethorphan for cough. You can use a humidifier for chest congestion and cough.  If you don't have a humidifier, you can sit in the bathroom with the hot shower running.      For sore throat: try warm salt water gargles, cepacol lozenges, throat spray, warm tea or water with lemon/honey, popsicles or ice, or OTC cold relief medicine for throat discomfort.   For congestion: take a daily anti-histamine like Zyrtec, Claritin, and a oral decongestant, such as pseudoephedrine.  You can also use Flonase 1-2 sprays in each nostril daily.   It is important to stay hydrated: drink plenty of fluids (water, gatorade/powerade/pedialyte, juices, or teas) to keep your throat moisturized and help further relieve irritation/discomfort.

## 2023-07-15 NOTE — ED Provider Notes (Signed)
Jackie French    CSN: [id number] Arrival date & time: 07/15/23  8413      History   Chief Complaint Chief Complaint  Patient presents with   Cough    HPI Jackie French is a 72 y.o. female.   Patient presents for evaluation of nasal congestion, rhinorrhea, sore throat, right-sided ear pain and intermittent generalized headaches present for 5 days.  Was told by family member that she has been wheezing.  Has attempted use of albuterol inhaler and Tylenol.  Symptoms began after travel to [place] for the holiday, also sick contacts and visiting counseled.  Denies presence of fever or shortness of breath.  Poor appetite but able to tolerate food and liquids.  Past Medical History:  Diagnosis Date   Asthma, intermittent, uncomplicated    Diabetes mellitus without complication (HCC)    Hypertension    Hypothyroidism    Thyroid disease     Patient Active Problem List   Diagnosis Date Noted   Syncope and collapse 01/30/2023   Morbid obesity (HCC) 01/30/2023   Primary hypertension 01/30/2023   Mixed hyperlipidemia 01/30/2023   Non-insulin dependent type 2 diabetes mellitus (HCC) 01/30/2023    Past Surgical History:  Procedure Laterality Date   ABDOMINAL HYSTERECTOMY     ECTROPION REPAIR Bilateral 12/08/2022   Procedure: ECTROPION REPAIR, TARSAL WEDGE ECTROPION REPAIR, EXTENSIVE BILATERAL;  Surgeon: Imagene Riches, MD;  Location: [facility];  Service: Ophthalmology;  Laterality: Bilateral;   THYROIDECTOMY  2021    OB History   No obstetric history on file.      Home Medications    Prior to Admission medications   Medication Sig Start Date End Date Taking? Authorizing Provider  amLODipine (NORVASC) 5 MG tablet Take 5 mg by mouth daily. 10/04/18  Yes [provider]  atorvastatin (LIPITOR) 20 MG tablet Take 20 mg by mouth Nightly. 09/24/18  Yes [provider]  azithromycin (ZITHROMAX) 250 MG tablet Take 1 tablet (250 mg total) by  mouth daily. Take first 2 tablets together, then 1 every day until finished. 07/15/23  Yes Cammie Faulstich R, NP  benzonatate (TESSALON) 100 MG capsule Take 1 capsule (100 mg total) by mouth every 8 (eight) hours. 07/15/23  Yes Artez Regis R, NP  Blood Glucose Monitoring Suppl (ONE TOUCH ULTRA 2) w/Device KIT daily. 12/11/22  Yes [provider]  fluticasone (FLONASE) 50 MCG/ACT nasal spray Place 1 spray into both nostrils daily.   Yes [provider]  Levothyroxine Sodium (SYNTHROID PO) Take 100 mg by mouth daily.   Yes [provider]  losartan-hydrochlorothiazide (HYZAAR) 100-25 MG tablet Take 1 tablet by mouth daily.   Yes [provider]  montelukast (SINGULAIR) 10 MG tablet Take 10 mg by mouth Nightly.   Yes [provider]  OZEMPIC, 1 MG/DOSE, 4 MG/3ML SOPN Inject 1 mg into the skin once a week. 06/29/23  Yes [provider]  predniSONE (DELTASONE) 20 MG tablet Take 2 tablets (40 mg total) by mouth daily. 07/15/23  Yes Cambryn Charters, Elita Boone, NP  Accu-Chek Softclix Lancets lancets daily. 12/12/22   [provider]  cetirizine (ZYRTEC) 10 MG tablet Take 10 mg by mouth daily.    [provider]  Cysteamine Bitartrate (PROCYSBI) 300 MG PACK Check blood sugar once daily fasting. Dx E11.9 ONE TOUCH Patient not taking: Reported on 02/21/2023 12/11/22   [provider]  dapagliflozin propanediol (FARXIGA) 10 MG TABS tablet Take 10 mg by mouth daily.  [provider]  erythromycin ophthalmic ointment Apply to sutures 4 times a day for 10-12 days.  Discontinue if allergy develops and call our office Patient not taking: Reported on 02/21/2023 12/08/22   Imagene Riches, MD  metFORMIN (GLUCOPHAGE-XR) 500 MG 24 hr tablet Take 1,000 mg by mouth 2 (two) times daily. 07/16/18 12/08/22  [provider]  ondansetron (ZOFRAN-ODT) 4 MG disintegrating tablet Take 1 tablet (4 mg total) by mouth every 8 (eight) hours as  needed for nausea or vomiting. Patient not taking: Reported on 04/16/2023 12/10/21   Mickie Bail, NP  [facility] [place] ULTRA test strip CHECK BLOOD SUGAR ONCE DAILY FASTING. DX E11.9 ONE TOUCH Patient not taking: Reported on 02/21/2023    [provider]  pimecrolimus (ELIDEL) 1 % cream Apply 1 Application topically as needed. Patient not taking: Reported on 01/30/2023 08/09/20   [provider]  traMADol (ULTRAM) 50 MG tablet Take 1 every 4-6 hours as needed for pain not controlled by Tylenol Patient not taking: Reported on 01/30/2023 12/08/22   Imagene Riches, MD    Family History History reviewed. No pertinent family history.  Social History Social History   Tobacco Use   Smoking status: Never   Smokeless tobacco: Never  Vaping Use   Vaping status: Never Used  Substance Use Topics   Alcohol use: Yes    Comment: occasionally   Drug use: Never     Allergies   Penicillins and Shellfish allergy   Review of Systems Review of Systems   Physical Exam Triage Vital Signs ED Triage Vitals  Encounter Vitals Group     BP 07/15/23 0913 139/79     Systolic BP Percentile --      Diastolic BP Percentile --      Pulse Rate 07/15/23 0913 84     Resp 07/15/23 0913 18     Temp 07/15/23 0913 98.9 F (37.2 C)     Temp Source 07/15/23 0913 Oral     SpO2 07/15/23 0913 97 %     Weight --      Height --      Head Circumference --      Peak Flow --      Pain Score 07/15/23 0914 6     Pain Loc --      Pain Education --      Exclude from Growth Chart --    No data found.  Updated Vital Signs BP 139/79 (BP Location: Right Arm)   Pulse 84   Temp 98.9 F (37.2 C) (Oral)   Resp 18   SpO2 97%   Visual Acuity Right Eye Distance:   Left Eye Distance:   Bilateral Distance:    Right Eye Near:   Left Eye Near:    Bilateral Near:     Physical Exam Constitutional:      Appearance: Normal appearance.  HENT:     Head: Normocephalic.     Right Ear: Tympanic membrane, ear  canal and external ear normal.     Left Ear: Tympanic membrane, ear canal and external ear normal.     Nose: Congestion present. No rhinorrhea.     Mouth/Throat:     Pharynx: Posterior oropharyngeal erythema present. No oropharyngeal exudate.  Eyes:     Extraocular Movements: Extraocular movements intact.  Cardiovascular:     Rate and Rhythm: Normal rate and regular rhythm.     Pulses: Normal pulses.     Heart sounds: Normal heart sounds.  Pulmonary:  Effort: Pulmonary effort is normal.     Breath sounds: Normal breath sounds.  Musculoskeletal:     Cervical back: Normal range of motion and neck supple.  Neurological:     Mental Status: She is alert and oriented to person, place, and time. Mental status is at baseline.      UC Treatments / Results  Labs (all labs ordered are listed, but only abnormal results are displayed) Labs Reviewed - No data to display  EKG   Radiology No results found.  Procedures Procedures (including critical care time)  Medications Ordered in UC Medications - No data to display  Initial Impression / Assessment and Plan / UC Course  I have reviewed the triage vital signs and the nursing notes.  Pertinent labs & imaging results that were available during my care of the patient were reviewed by me and considered in my medical decision making (see chart for details).  Acute URI, mild intermittent asthma with acute exacerbation  Patient is in no signs of distress nor toxic appearing.  Vital signs are stable.  Low suspicion for pneumonia, pneumothorax or bronchitis and therefore will defer imaging.  Etiology most likely viral due to recent travel and sick contacts, history of asthma will prophylactically provide antibiotic to ensure symptoms do not worsen, prescribed azithromycin, for treatment of wheezing prescribed prednisone and Tessalon for management of cough, May use additional over-the-counter medications as needed for supportive care.  May  follow-up with urgent care as needed if symptoms persist or worsen.    Final Clinical Impressions(s) / UC Diagnoses   Final diagnoses:  Acute URI  Wheezing     Discharge Instructions      Your symptoms today are most likely being caused by a virus and should steadily improve in time it can take up to 7 to 10 days before you truly start to see a turnaround however things will get better  Begin azithromycin prophylactically which will prevent symptoms from worsening to more serious respiratory condition such as pneumonia  Begin prednisone every morning with food to open and relax the airway should make it easier to breathe and help settle wheezing  You may use Tessalon pill every 8 hours as needed to help calm coughing    You can take Tylenol and/or Ibuprofen as needed for fever reduction and pain relief.   For cough: honey 1/2 to 1 teaspoon (you can dilute the honey in water or another fluid).  You can also use guaifenesin and dextromethorphan for cough. You can use a humidifier for chest congestion and cough.  If you don't have a humidifier, you can sit in the bathroom with the hot shower running.      For sore throat: try warm salt water gargles, cepacol lozenges, throat spray, warm tea or water with lemon/honey, popsicles or ice, or OTC cold relief medicine for throat discomfort.   For congestion: take a daily anti-histamine like Zyrtec, Claritin, and a oral decongestant, such as pseudoephedrine.  You can also use Flonase 1-2 sprays in each nostril daily.   It is important to stay hydrated: drink plenty of fluids (water, gatorade/powerade/pedialyte, juices, or teas) to keep your throat moisturized and help further relieve irritation/discomfort.    ED Prescriptions     Medication Sig Dispense Auth. Provider   azithromycin (ZITHROMAX) 250 MG tablet Take 1 tablet (250 mg total) by mouth daily. Take first 2 tablets together, then 1 every day until finished. 6 tablet Valinda Hoar, NP   predniSONE (  DELTASONE) 20 MG tablet Take 2 tablets (40 mg total) by mouth daily. 10 tablet Adrinne Sze R, NP   benzonatate (TESSALON) 100 MG capsule Take 1 capsule (100 mg total) by mouth every 8 (eight) hours. 21 capsule Eldred Sooy, Elita Boone, NP      PDMP not reviewed this encounter.   Valinda Hoar, NP 07/15/23 [phone]
